# Patient Record
Sex: Female | Born: 1950
Health system: Southern US, Community
[De-identification: ages and names within clinical notes are randomized; demographics above are authoritative.]

## PROBLEM LIST (undated history)

## (undated) DIAGNOSIS — E069 Thyroiditis, unspecified: Secondary | ICD-10-CM

## (undated) DIAGNOSIS — E785 Hyperlipidemia, unspecified: Secondary | ICD-10-CM

## (undated) HISTORY — PX: TONSILLECTOMY: SUR1361

## (undated) HISTORY — PX: DIAGNOSTIC LAPAROSCOPY: SUR761

---

## 2004-04-26 ENCOUNTER — Ambulatory Visit: Payer: Self-pay | Admitting: Gastroenterology

## 2005-08-15 ENCOUNTER — Ambulatory Visit: Payer: Self-pay | Admitting: Unknown Physician Specialty

## 2006-10-07 ENCOUNTER — Ambulatory Visit: Payer: Self-pay | Admitting: Unknown Physician Specialty

## 2007-12-08 ENCOUNTER — Ambulatory Visit: Payer: Self-pay | Admitting: Unknown Physician Specialty

## 2009-01-06 ENCOUNTER — Ambulatory Visit: Payer: Self-pay | Admitting: Podiatry

## 2010-01-15 ENCOUNTER — Ambulatory Visit: Payer: Self-pay | Admitting: Unknown Physician Specialty

## 2011-02-06 ENCOUNTER — Ambulatory Visit: Payer: Self-pay | Admitting: Unknown Physician Specialty

## 2012-02-07 ENCOUNTER — Ambulatory Visit: Payer: Self-pay | Admitting: Physician Assistant

## 2013-02-12 ENCOUNTER — Ambulatory Visit: Payer: Self-pay | Admitting: Physician Assistant

## 2013-04-22 ENCOUNTER — Encounter (HOSPITAL_COMMUNITY): Payer: Self-pay | Admitting: Emergency Medicine

## 2013-04-22 ENCOUNTER — Emergency Department (HOSPITAL_COMMUNITY)
Admission: EM | Admit: 2013-04-22 | Discharge: 2013-04-22 | Disposition: A | Discharge status: Home / Self Care | Payer: BC Managed Care – PPO | Attending: Emergency Medicine | Admitting: Emergency Medicine

## 2013-04-22 DIAGNOSIS — R110 Nausea: Secondary | ICD-10-CM | POA: Insufficient documentation

## 2013-04-22 DIAGNOSIS — Z79899 Other long term (current) drug therapy: Secondary | ICD-10-CM | POA: Insufficient documentation

## 2013-04-22 DIAGNOSIS — R61 Generalized hyperhidrosis: Secondary | ICD-10-CM | POA: Insufficient documentation

## 2013-04-22 DIAGNOSIS — R55 Syncope and collapse: Secondary | ICD-10-CM | POA: Insufficient documentation

## 2013-04-22 LAB — CBC WITH DIFFERENTIAL/PLATELET
Basophils Relative: 0 % (ref 0–1)
HCT: 38.8 % (ref 36.0–46.0)
Hemoglobin: 13.6 g/dL (ref 12.0–15.0)
Lymphs Abs: 1 10*3/uL (ref 0.7–4.0)
MCH: 32.7 pg (ref 26.0–34.0)
MCHC: 35.1 g/dL (ref 30.0–36.0)
Monocytes Absolute: 0.3 10*3/uL (ref 0.1–1.0)
Monocytes Relative: 4 % (ref 3–12)
Neutro Abs: 6 10*3/uL (ref 1.7–7.7)
RBC: 4.16 MIL/uL (ref 3.87–5.11)
WBC: 7.4 10*3/uL (ref 4.0–10.5)

## 2013-04-22 LAB — COMPREHENSIVE METABOLIC PANEL
ALT: 12 U/L (ref 0–35)
AST: 18 U/L (ref 0–37)
CO2: 28 mEq/L (ref 19–32)
Calcium: 9.3 mg/dL (ref 8.4–10.5)
Chloride: 103 mEq/L (ref 96–112)
Creatinine, Ser: 0.77 mg/dL (ref 0.50–1.10)
GFR calc Af Amer: >90 mL/min (ref >90)
GFR calc non Af Amer: 89 mL/min — ABNORMAL LOW (ref >90)
Sodium: 140 mEq/L (ref 135–145)
Total Protein: 6.8 g/dL (ref 6.0–8.3)

## 2013-04-22 NOTE — ED Notes (Signed)
nad noted prior to dc dc instructions reviewed and explained. F/u care instructed. Voiced understanding.

## 2013-04-22 NOTE — ED Notes (Signed)
Pt states generalized weakness this morning with episode of "breaking out into a sweat"
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
## 2014-02-16 ENCOUNTER — Ambulatory Visit: Payer: Self-pay | Admitting: Physician Assistant

## 2014-07-05 ENCOUNTER — Ambulatory Visit: Payer: Self-pay | Admitting: Gastroenterology

## 2015-01-25 ENCOUNTER — Other Ambulatory Visit: Payer: Self-pay | Admitting: Physician Assistant

## 2015-01-25 DIAGNOSIS — Z1231 Encounter for screening mammogram for malignant neoplasm of breast: Secondary | ICD-10-CM

## 2015-03-10 ENCOUNTER — Ambulatory Visit
Admission: RE | Admit: 2015-03-10 | Discharge: 2015-03-10 | Disposition: A | Discharge status: Home / Self Care | Payer: BLUE CROSS/BLUE SHIELD | Source: Ambulatory Visit | Attending: Physician Assistant | Admitting: Physician Assistant

## 2015-03-10 DIAGNOSIS — Z1231 Encounter for screening mammogram for malignant neoplasm of breast: Secondary | ICD-10-CM | POA: Insufficient documentation

## 2016-02-20 ENCOUNTER — Other Ambulatory Visit: Payer: Self-pay | Admitting: Physician Assistant

## 2016-02-20 DIAGNOSIS — Z1231 Encounter for screening mammogram for malignant neoplasm of breast: Secondary | ICD-10-CM

## 2016-03-19 ENCOUNTER — Other Ambulatory Visit: Payer: Self-pay | Admitting: Physician Assistant

## 2016-03-19 ENCOUNTER — Ambulatory Visit
Admission: RE | Admit: 2016-03-19 | Discharge: 2016-03-19 | Disposition: A | Discharge status: Home / Self Care | Payer: BLUE CROSS/BLUE SHIELD | Source: Ambulatory Visit | Attending: Physician Assistant | Admitting: Physician Assistant

## 2016-03-19 DIAGNOSIS — Z1231 Encounter for screening mammogram for malignant neoplasm of breast: Secondary | ICD-10-CM | POA: Diagnosis not present

## 2017-03-12 ENCOUNTER — Other Ambulatory Visit: Payer: Self-pay | Admitting: Physician Assistant

## 2017-03-12 DIAGNOSIS — Z1231 Encounter for screening mammogram for malignant neoplasm of breast: Secondary | ICD-10-CM

## 2017-03-24 ENCOUNTER — Ambulatory Visit
Admission: RE | Admit: 2017-03-24 | Discharge: 2017-03-24 | Disposition: A | Discharge status: Home / Self Care | Payer: BLUE CROSS/BLUE SHIELD | Source: Ambulatory Visit | Attending: Physician Assistant | Admitting: Physician Assistant

## 2017-03-24 DIAGNOSIS — Z1231 Encounter for screening mammogram for malignant neoplasm of breast: Secondary | ICD-10-CM | POA: Insufficient documentation

## 2017-12-29 DIAGNOSIS — E538 Deficiency of other specified B group vitamins: Secondary | ICD-10-CM | POA: Diagnosis not present

## 2018-01-29 DIAGNOSIS — E538 Deficiency of other specified B group vitamins: Secondary | ICD-10-CM | POA: Diagnosis not present

## 2018-03-06 DIAGNOSIS — E538 Deficiency of other specified B group vitamins: Secondary | ICD-10-CM | POA: Diagnosis not present

## 2018-03-11 DIAGNOSIS — Z0000 Encounter for general adult medical examination without abnormal findings: Secondary | ICD-10-CM | POA: Diagnosis not present

## 2018-03-11 DIAGNOSIS — E782 Mixed hyperlipidemia: Secondary | ICD-10-CM | POA: Diagnosis not present

## 2018-03-17 DIAGNOSIS — M810 Age-related osteoporosis without current pathological fracture: Secondary | ICD-10-CM | POA: Diagnosis not present

## 2018-03-17 DIAGNOSIS — E538 Deficiency of other specified B group vitamins: Secondary | ICD-10-CM | POA: Diagnosis not present

## 2018-03-17 DIAGNOSIS — Z1239 Encounter for other screening for malignant neoplasm of breast: Secondary | ICD-10-CM | POA: Diagnosis not present

## 2018-03-17 DIAGNOSIS — E782 Mixed hyperlipidemia: Secondary | ICD-10-CM | POA: Diagnosis not present

## 2018-03-17 DIAGNOSIS — Z0000 Encounter for general adult medical examination without abnormal findings: Secondary | ICD-10-CM | POA: Diagnosis not present

## 2018-03-18 ENCOUNTER — Other Ambulatory Visit: Payer: Self-pay | Admitting: Physician Assistant

## 2018-03-18 DIAGNOSIS — Z1231 Encounter for screening mammogram for malignant neoplasm of breast: Secondary | ICD-10-CM

## 2018-04-03 ENCOUNTER — Ambulatory Visit
Admission: RE | Admit: 2018-04-03 | Discharge: 2018-04-03 | Disposition: A | Discharge status: Home / Self Care | Payer: Medicare HMO | Source: Ambulatory Visit | Attending: Physician Assistant | Admitting: Physician Assistant

## 2018-04-03 DIAGNOSIS — Z1231 Encounter for screening mammogram for malignant neoplasm of breast: Secondary | ICD-10-CM | POA: Insufficient documentation

## 2018-04-03 DIAGNOSIS — M810 Age-related osteoporosis without current pathological fracture: Secondary | ICD-10-CM | POA: Diagnosis not present

## 2018-04-06 DIAGNOSIS — E538 Deficiency of other specified B group vitamins: Secondary | ICD-10-CM | POA: Diagnosis not present

## 2018-05-07 DIAGNOSIS — E538 Deficiency of other specified B group vitamins: Secondary | ICD-10-CM | POA: Diagnosis not present

## 2018-06-09 DIAGNOSIS — E538 Deficiency of other specified B group vitamins: Secondary | ICD-10-CM | POA: Diagnosis not present

## 2018-07-10 DIAGNOSIS — E538 Deficiency of other specified B group vitamins: Secondary | ICD-10-CM | POA: Diagnosis not present

## 2018-08-14 DIAGNOSIS — E538 Deficiency of other specified B group vitamins: Secondary | ICD-10-CM | POA: Diagnosis not present

## 2018-09-02 DIAGNOSIS — E782 Mixed hyperlipidemia: Secondary | ICD-10-CM | POA: Diagnosis not present

## 2018-09-02 DIAGNOSIS — E538 Deficiency of other specified B group vitamins: Secondary | ICD-10-CM | POA: Diagnosis not present

## 2018-09-02 DIAGNOSIS — M810 Age-related osteoporosis without current pathological fracture: Secondary | ICD-10-CM | POA: Diagnosis not present

## 2018-09-16 DIAGNOSIS — M79672 Pain in left foot: Secondary | ICD-10-CM | POA: Diagnosis not present

## 2018-09-16 DIAGNOSIS — B351 Tinea unguium: Secondary | ICD-10-CM | POA: Diagnosis not present

## 2018-09-16 DIAGNOSIS — M898X9 Other specified disorders of bone, unspecified site: Secondary | ICD-10-CM | POA: Diagnosis not present

## 2018-09-16 DIAGNOSIS — D2371 Other benign neoplasm of skin of right lower limb, including hip: Secondary | ICD-10-CM | POA: Diagnosis not present

## 2018-09-16 DIAGNOSIS — M79671 Pain in right foot: Secondary | ICD-10-CM | POA: Diagnosis not present

## 2018-09-16 DIAGNOSIS — D2372 Other benign neoplasm of skin of left lower limb, including hip: Secondary | ICD-10-CM | POA: Diagnosis not present

## 2018-09-18 DIAGNOSIS — Z0000 Encounter for general adult medical examination without abnormal findings: Secondary | ICD-10-CM | POA: Diagnosis not present

## 2018-09-18 DIAGNOSIS — E538 Deficiency of other specified B group vitamins: Secondary | ICD-10-CM | POA: Diagnosis not present

## 2018-09-18 DIAGNOSIS — K5900 Constipation, unspecified: Secondary | ICD-10-CM | POA: Diagnosis not present

## 2018-09-18 DIAGNOSIS — M810 Age-related osteoporosis without current pathological fracture: Secondary | ICD-10-CM | POA: Diagnosis not present

## 2018-09-18 DIAGNOSIS — E782 Mixed hyperlipidemia: Secondary | ICD-10-CM | POA: Diagnosis not present

## 2018-10-07 DIAGNOSIS — M79671 Pain in right foot: Secondary | ICD-10-CM | POA: Diagnosis not present

## 2018-10-07 DIAGNOSIS — D2371 Other benign neoplasm of skin of right lower limb, including hip: Secondary | ICD-10-CM | POA: Diagnosis not present

## 2018-10-07 DIAGNOSIS — D2372 Other benign neoplasm of skin of left lower limb, including hip: Secondary | ICD-10-CM | POA: Diagnosis not present

## 2018-10-07 DIAGNOSIS — M79672 Pain in left foot: Secondary | ICD-10-CM | POA: Diagnosis not present

## 2018-10-19 DIAGNOSIS — E538 Deficiency of other specified B group vitamins: Secondary | ICD-10-CM | POA: Diagnosis not present

## 2018-10-28 DIAGNOSIS — D2371 Other benign neoplasm of skin of right lower limb, including hip: Secondary | ICD-10-CM | POA: Diagnosis not present

## 2018-10-28 DIAGNOSIS — M79672 Pain in left foot: Secondary | ICD-10-CM | POA: Diagnosis not present

## 2018-10-28 DIAGNOSIS — D2372 Other benign neoplasm of skin of left lower limb, including hip: Secondary | ICD-10-CM | POA: Diagnosis not present

## 2018-11-19 DIAGNOSIS — E538 Deficiency of other specified B group vitamins: Secondary | ICD-10-CM | POA: Diagnosis not present

## 2018-12-03 DIAGNOSIS — Z86018 Personal history of other benign neoplasm: Secondary | ICD-10-CM | POA: Diagnosis not present

## 2018-12-03 DIAGNOSIS — L578 Other skin changes due to chronic exposure to nonionizing radiation: Secondary | ICD-10-CM | POA: Diagnosis not present

## 2018-12-03 DIAGNOSIS — Z859 Personal history of malignant neoplasm, unspecified: Secondary | ICD-10-CM | POA: Diagnosis not present

## 2018-12-03 DIAGNOSIS — L821 Other seborrheic keratosis: Secondary | ICD-10-CM | POA: Diagnosis not present

## 2018-12-03 DIAGNOSIS — Z872 Personal history of diseases of the skin and subcutaneous tissue: Secondary | ICD-10-CM | POA: Diagnosis not present

## 2018-12-22 DIAGNOSIS — E538 Deficiency of other specified B group vitamins: Secondary | ICD-10-CM | POA: Diagnosis not present

## 2019-01-22 DIAGNOSIS — E538 Deficiency of other specified B group vitamins: Secondary | ICD-10-CM | POA: Diagnosis not present

## 2019-02-22 DIAGNOSIS — E538 Deficiency of other specified B group vitamins: Secondary | ICD-10-CM | POA: Diagnosis not present

## 2019-03-12 DIAGNOSIS — Z0000 Encounter for general adult medical examination without abnormal findings: Secondary | ICD-10-CM | POA: Diagnosis not present

## 2019-03-12 DIAGNOSIS — E782 Mixed hyperlipidemia: Secondary | ICD-10-CM | POA: Diagnosis not present

## 2019-03-19 DIAGNOSIS — E782 Mixed hyperlipidemia: Secondary | ICD-10-CM | POA: Diagnosis not present

## 2019-03-19 DIAGNOSIS — M8580 Other specified disorders of bone density and structure, unspecified site: Secondary | ICD-10-CM | POA: Diagnosis not present

## 2019-03-19 DIAGNOSIS — Z0000 Encounter for general adult medical examination without abnormal findings: Secondary | ICD-10-CM | POA: Diagnosis not present

## 2019-03-19 DIAGNOSIS — Z1239 Encounter for other screening for malignant neoplasm of breast: Secondary | ICD-10-CM | POA: Diagnosis not present

## 2019-03-19 DIAGNOSIS — Z8639 Personal history of other endocrine, nutritional and metabolic disease: Secondary | ICD-10-CM | POA: Diagnosis not present

## 2019-03-19 DIAGNOSIS — E538 Deficiency of other specified B group vitamins: Secondary | ICD-10-CM | POA: Diagnosis not present

## 2019-03-25 ENCOUNTER — Other Ambulatory Visit: Payer: Self-pay | Admitting: Physician Assistant

## 2019-03-25 DIAGNOSIS — E538 Deficiency of other specified B group vitamins: Secondary | ICD-10-CM | POA: Diagnosis not present

## 2019-03-25 DIAGNOSIS — Z1231 Encounter for screening mammogram for malignant neoplasm of breast: Secondary | ICD-10-CM

## 2019-04-28 DIAGNOSIS — E538 Deficiency of other specified B group vitamins: Secondary | ICD-10-CM | POA: Diagnosis not present

## 2019-05-04 ENCOUNTER — Ambulatory Visit
Admission: RE | Admit: 2019-05-04 | Discharge: 2019-05-04 | Disposition: A | Discharge status: Home / Self Care | Payer: Medicare HMO | Source: Ambulatory Visit | Attending: Physician Assistant | Admitting: Physician Assistant

## 2019-05-04 DIAGNOSIS — Z1231 Encounter for screening mammogram for malignant neoplasm of breast: Secondary | ICD-10-CM | POA: Insufficient documentation

## 2019-06-02 DIAGNOSIS — E538 Deficiency of other specified B group vitamins: Secondary | ICD-10-CM | POA: Diagnosis not present

## 2019-07-07 DIAGNOSIS — E538 Deficiency of other specified B group vitamins: Secondary | ICD-10-CM | POA: Diagnosis not present

## 2019-08-09 DIAGNOSIS — E538 Deficiency of other specified B group vitamins: Secondary | ICD-10-CM | POA: Diagnosis not present

## 2019-09-09 DIAGNOSIS — E538 Deficiency of other specified B group vitamins: Secondary | ICD-10-CM | POA: Diagnosis not present

## 2019-09-09 DIAGNOSIS — E782 Mixed hyperlipidemia: Secondary | ICD-10-CM | POA: Diagnosis not present

## 2019-09-09 DIAGNOSIS — Z8639 Personal history of other endocrine, nutritional and metabolic disease: Secondary | ICD-10-CM | POA: Diagnosis not present

## 2019-09-13 DIAGNOSIS — B351 Tinea unguium: Secondary | ICD-10-CM | POA: Diagnosis not present

## 2019-09-13 DIAGNOSIS — M79672 Pain in left foot: Secondary | ICD-10-CM | POA: Diagnosis not present

## 2019-09-13 DIAGNOSIS — M79671 Pain in right foot: Secondary | ICD-10-CM | POA: Diagnosis not present

## 2019-09-13 DIAGNOSIS — D2372 Other benign neoplasm of skin of left lower limb, including hip: Secondary | ICD-10-CM | POA: Diagnosis not present

## 2019-09-16 DIAGNOSIS — E538 Deficiency of other specified B group vitamins: Secondary | ICD-10-CM | POA: Diagnosis not present

## 2019-09-16 DIAGNOSIS — Z0000 Encounter for general adult medical examination without abnormal findings: Secondary | ICD-10-CM | POA: Diagnosis not present

## 2019-09-16 DIAGNOSIS — Z8639 Personal history of other endocrine, nutritional and metabolic disease: Secondary | ICD-10-CM | POA: Diagnosis not present

## 2019-09-16 DIAGNOSIS — E559 Vitamin D deficiency, unspecified: Secondary | ICD-10-CM | POA: Diagnosis not present

## 2019-09-16 DIAGNOSIS — E785 Hyperlipidemia, unspecified: Secondary | ICD-10-CM | POA: Diagnosis not present

## 2019-09-16 DIAGNOSIS — M8580 Other specified disorders of bone density and structure, unspecified site: Secondary | ICD-10-CM | POA: Diagnosis not present

## 2019-09-23 DIAGNOSIS — R2232 Localized swelling, mass and lump, left upper limb: Secondary | ICD-10-CM | POA: Diagnosis not present

## 2019-09-24 ENCOUNTER — Ambulatory Visit: Payer: Self-pay | Admitting: General Surgery

## 2019-09-24 NOTE — H&P (Signed)
PATIENT PROFILE: Mary Potts is a 69 y.o. female who presents to the Clinic for consultation at the request of Mary Potts, [place] for evaluation of mass on the left arm.  PCP: Mary Nightingale, PA  HISTORY OF PRESENT ILLNESS: Mary Potts reports she has a mass on the left arm since more than 10 years ago. She reported that he has been increasing in size. There is minimal pain. There is no pain radiation. There is no alleviating or aggravating factor. Patient does report discomfort when she pressed on the mass. There is center movement that caused the area to be very uncomfortable. She endorses significant increase in the left last few months. Denies any fever or chills. She does report intermittent changes in skin color and temperature. No drainage.  PROBLEM LIST: Problem List Date Reviewed: 10/29/2018  Noted  B12 deficiency 02/20/2016  Overview  Paresthesias   Osteoporosis 02/16/2015  Hyperlipemia, mixed Unknown  Hx of thyroiditis Unknown  Overview  resolved   Arm fracture Unknown    GENERAL REVIEW OF SYSTEMS:   General ROS: negative for - chills, fatigue, fever, weight gain or weight loss Allergy and Immunology ROS: negative for - hives  Hematological and Lymphatic ROS: negative for - bleeding problems or bruising, negative for palpable nodes Endocrine ROS: negative for - heat or cold intolerance, hair changes Respiratory ROS: negative for - cough, shortness of breath or wheezing Cardiovascular ROS: no chest pain or palpitations GI ROS: negative for nausea, vomiting, abdominal pain, diarrhea, constipation Musculoskeletal ROS: negative for - joint swelling or muscle pain Neurological ROS: negative for - confusion, syncope Dermatological ROS: negative for pruritus and rash Psychiatric: negative for anxiety, depression, difficulty sleeping and memory loss  MEDICATIONS: Current Outpatient Medications  Medication Sig Dispense Refill  . alendronate (FOSAMAX) 70 MG tablet  TAKE 1 TABLET EVERY WEEK WITH A FULL GLASS OF WATER. DO NOT LIE DOWN FOR THE NEXT 30 MINUTES 12 tablet 1  . diazePAM (VALIUM) 5 MG tablet Take 1 tablet (5 mg total) by mouth once daily as needed for Anxiety 15 tablet 1  . GARLIC (GARLIQUE ORAL) Take by mouth.  . lecithin, soy 400 mg Cap Take by mouth once daily.  . multivitamin tablet Take 1 tablet by mouth once daily.  Marland Kitchen omega 3-dha-epa-fish oil 900 mg-360 mg- 455 mg-1,000 mg Cap  . red yeast rice 600 mg Cap Take by mouth once daily.  . vitamin E 400 UNIT capsule Take by mouth once daily.   Current Facility-Administered Medications  Medication Dose Route Frequency Provider Last Rate Last Admin  . cyanocobalamin (VITAMIN B12) injection 1,000 mcg 1,000 mcg Intramuscular Q30 Days Mary Potts [place], [place] 1,000 mcg at 09/09/19 5170   ALLERGIES: Sulfa (sulfonamide antibiotics)  PAST MEDICAL HISTORY: Past Medical History:  Diagnosis Date  . Arm fracture 07/04  . Endometriosis  . Fibrocystic breast disease  . Other and unspecified hyperlipidemia  . Thyroiditis  resolved   PAST SURGICAL HISTORY: Past Surgical History:  Procedure Laterality Date  . COLONOSCOPY 07/05/2014  Normal/Colon/Repeat 70yr/PYO  . EXPLORATORY LAPAROTOMY 1997  . Lysis of adhesions  . TONSILLECTOMY remote past  . TUBAL LIGATION    FAMILY HISTORY: Family History  Problem Relation Age of Onset  . Thyroid cancer Mother  . High blood pressure (Hypertension) Mother  . Stroke Mother    SOCIAL HISTORY: Social History   Socioeconomic History  . Marital status: Married  Spouse name: Not on file  . Number of children: Not on file  .  Years of education: Not on file  . Highest education level: Not on file  Occupational History  . Occupation: [employer]  Social Needs  . Financial resource strain: Not on file  . Food insecurity  Worry: Not on file  Inability: Not on file  . Transportation needs  Medical: Not on file  Non-medical: Not on  file  Tobacco Use  . Smoking status: Never Smoker  . Smokeless tobacco: Never Used  Substance and Sexual Activity  . Alcohol use: No  Alcohol/week: 0.0 standard drinks  . Drug use: No  . Sexual activity: Defer  Other Topics Concern  . Not on file  Social History Narrative  . Not on file   PHYSICAL EXAM: Vitals:  09/23/19 1345  BP: 156/87  Pulse: 90   Body mass index is 26.75 kg/m. Weight: 68.5 kg (151 lb)   GENERAL: Alert, active, oriented x3  HEENT: Pupils equal reactive to light. Extraocular movements are intact. Sclera clear. Palpebral conjunctiva normal red color.Pharynx clear.  NECK: Supple with no palpable mass and no adenopathy.  LUNGS: Sound clear with no rales rhonchi or wheezes.  HEART: Regular rhythm S1 and S2 without murmur.  ABDOMEN: Soft and depressible, nontender with no palpable mass, no hepatomegaly.   EXTREMITIES: Well-developed well-nourished symmetrical with no dependent edema. There is a large mass more than 10 cm on the left arm. The masses continues with the left tricep area. No sign of fluid collection.  NEUROLOGICAL: Awake alert oriented, facial expression symmetrical, moving all extremities.  REVIEW OF DATA: I have reviewed the following data today: Initial consult on 09/23/2019  Component Date Value  . WBC (White Blood Cell Co* 09/23/2019 6.6  . RBC (Red Blood Cell Coun* 09/23/2019 4.12  . Hemoglobin 09/23/2019 13.3  . Hematocrit 09/23/2019 39.3  . MCV (Mean Corpuscular Vo* 09/23/2019 95.4  . MCH (Mean Corpuscular He* 09/23/2019 32.3*  . MCHC (Mean Corpuscular H* 09/23/2019 33.8  . Platelet Count 09/23/2019 260  . RDW-CV (Red Cell Distrib* 09/23/2019 11.4*  . MPV (Mean Platelet Volum* 09/23/2019 11.3  . Neutrophils 09/23/2019 3.82  . Lymphocytes 09/23/2019 2.15  . Monocytes 09/23/2019 0.42  . Eosinophils 09/23/2019 0.16  . Basophils 09/23/2019 0.04  . Neutrophil % 09/23/2019 57.8  . Lymphocyte % 09/23/2019 32.5  . Monocyte %  09/23/2019 6.4  . Eosinophil % 09/23/2019 2.4  . Basophil% 09/23/2019 0.6  . Immature Granulocyte % 09/23/2019 0.3  . Immature Granulocyte Cou* 09/23/2019 0.02  Appointment on 09/09/2019  Component Date Value  . Glucose 09/09/2019 100  . Sodium 09/09/2019 142  . Potassium 09/09/2019 4.4  . Chloride 09/09/2019 105  . Carbon Dioxide (CO2) 09/09/2019 30.2  . Urea Nitrogen (BUN) 09/09/2019 13  . Creatinine 09/09/2019 0.8  . Glomerular Filtration Ra* 09/09/2019 72  . Calcium 09/09/2019 9.1  . AST 09/09/2019 15  . ALT 09/09/2019 17  . Alk Phos (alkaline Phosp* 09/09/2019 72  . Albumin 09/09/2019 3.8  . Bilirubin, Total 09/09/2019 0.5  . Protein, Total 09/09/2019 6.2  . A/G Ratio 09/09/2019 1.6  . Cholesterol, Total 09/09/2019 220*  . Triglyceride 09/09/2019 76  . HDL (High Density Lipopr* 09/09/2019 65.2  . LDL Calculated 09/09/2019 140*  . VLDL Cholesterol 09/09/2019 15  . Cholesterol/HDL Ratio 09/09/2019 3.4  . Thyroid Stimulating Horm* 09/09/2019 1.840    ASSESSMENT: Ms. Fluegge is a 69 y.o. female presenting for consultation for left arm mass.  Patient with a large left arm mass. She was oriented about the most  likely diagnosis of large lipoma. Due to the extension of the mass I would recommend to proceed with excision in the operating room. The dissection of this mass is can be somewhat deeper than usual and may be very uncomfortable for the patient to do it under just local anesthesia in the office. Patient was oriented about the benefit and risk of surgery including bleeding, infection, pain, scar. Patient should not have significant restrictions after surgery. Patient reports agreement with plan.  Mass of arm, left [R22.32]  PLAN: 1. Excision of left arm soft tissue mass (31674) 2. Avoid taking aspirin 5 days before the surgery.  3. Contact us if has any question or concern.   Patient verbalized understanding, all questions were answered, and were agreeable with the plan  outlined above.   Herbert Pun, MD

## 2019-09-24 NOTE — H&P (View-Only) (Signed)
PATIENT PROFILE: Mary Potts is a 69 y.o. female who presents to the Clinic for consultation at the request of Mary Potts, [place] for evaluation of mass on the left arm.  PCP: Mary Nightingale, PA  HISTORY OF PRESENT ILLNESS: Mary Potts reports she has a mass on the left arm since more than 10 years ago. She reported that he has been increasing in size. There is minimal pain. There is no pain radiation. There is no alleviating or aggravating factor. Patient does report discomfort when she pressed on the mass. There is center movement that caused the area to be very uncomfortable. She endorses significant increase in the left last few months. Denies any fever or chills. She does report intermittent changes in skin color and temperature. No drainage.  PROBLEM LIST: Problem List Date Reviewed: 10/29/2018  Noted  B12 deficiency 02/20/2016  Overview  Paresthesias   Osteoporosis 02/16/2015  Hyperlipemia, mixed Unknown  Hx of thyroiditis Unknown  Overview  resolved   Arm fracture Unknown    GENERAL REVIEW OF SYSTEMS:   General ROS: negative for - chills, fatigue, fever, weight gain or weight loss Allergy and Immunology ROS: negative for - hives  Hematological and Lymphatic ROS: negative for - bleeding problems or bruising, negative for palpable nodes Endocrine ROS: negative for - heat or cold intolerance, hair changes Respiratory ROS: negative for - cough, shortness of breath or wheezing Cardiovascular ROS: no chest pain or palpitations GI ROS: negative for nausea, vomiting, abdominal pain, diarrhea, constipation Musculoskeletal ROS: negative for - joint swelling or muscle pain Neurological ROS: negative for - confusion, syncope Dermatological ROS: negative for pruritus and rash Psychiatric: negative for anxiety, depression, difficulty sleeping and memory loss  MEDICATIONS: Current Outpatient Medications  Medication Sig Dispense Refill  . alendronate (FOSAMAX) 70 MG tablet  TAKE 1 TABLET EVERY WEEK WITH A FULL GLASS OF WATER. DO NOT LIE DOWN FOR THE NEXT 30 MINUTES 12 tablet 1  . diazePAM (VALIUM) 5 MG tablet Take 1 tablet (5 mg total) by mouth once daily as needed for Anxiety 15 tablet 1  . GARLIC (GARLIQUE ORAL) Take by mouth.  . lecithin, soy 400 mg Cap Take by mouth once daily.  . multivitamin tablet Take 1 tablet by mouth once daily.  Marland Kitchen omega 3-dha-epa-fish oil 900 mg-360 mg- 455 mg-1,000 mg Cap  . red yeast rice 600 mg Cap Take by mouth once daily.  . vitamin E 400 UNIT capsule Take by mouth once daily.   Current Facility-Administered Medications  Medication Dose Route Frequency Provider Last Rate Last Admin  . cyanocobalamin (VITAMIN B12) injection 1,000 mcg 1,000 mcg Intramuscular Q30 Days Mary Potts [place], [place] 1,000 mcg at 09/09/19 5997   ALLERGIES: Sulfa (sulfonamide antibiotics)  PAST MEDICAL HISTORY: Past Medical History:  Diagnosis Date  . Arm fracture 07/04  . Endometriosis  . Fibrocystic breast disease  . Other and unspecified hyperlipidemia  . Thyroiditis  resolved   PAST SURGICAL HISTORY: Past Surgical History:  Procedure Laterality Date  . COLONOSCOPY 07/05/2014  Normal/Colon/Repeat 13yr/PYO  . EXPLORATORY LAPAROTOMY 1997  . Lysis of adhesions  . TONSILLECTOMY remote past  . TUBAL LIGATION    FAMILY HISTORY: Family History  Problem Relation Age of Onset  . Thyroid cancer Mother  . High blood pressure (Hypertension) Mother  . Stroke Mother    SOCIAL HISTORY: Social History   Socioeconomic History  . Marital status: Married  Spouse name: Not on file  . Number of children: Not on file  .  Years of education: Not on file  . Highest education level: Not on file  Occupational History  . Occupation: [employer]  Social Needs  . Financial resource strain: Not on file  . Food insecurity  Worry: Not on file  Inability: Not on file  . Transportation needs  Medical: Not on file  Non-medical: Not on  file  Tobacco Use  . Smoking status: Never Smoker  . Smokeless tobacco: Never Used  Substance and Sexual Activity  . Alcohol use: No  Alcohol/week: 0.0 standard drinks  . Drug use: No  . Sexual activity: Defer  Other Topics Concern  . Not on file  Social History Narrative  . Not on file   PHYSICAL EXAM: Vitals:  09/23/19 1345  BP: 156/87  Pulse: 90   Body mass index is 26.75 kg/m. Weight: 68.5 kg (151 lb)   GENERAL: Alert, active, oriented x3  HEENT: Pupils equal reactive to light. Extraocular movements are intact. Sclera clear. Palpebral conjunctiva normal red color.Pharynx clear.  NECK: Supple with no palpable mass and no adenopathy.  LUNGS: Sound clear with no rales rhonchi or wheezes.  HEART: Regular rhythm S1 and S2 without murmur.  ABDOMEN: Soft and depressible, nontender with no palpable mass, no hepatomegaly.   EXTREMITIES: Well-developed well-nourished symmetrical with no dependent edema. There is a large mass more than 10 cm on the left arm. The masses continues with the left tricep area. No sign of fluid collection.  NEUROLOGICAL: Awake alert oriented, facial expression symmetrical, moving all extremities.  REVIEW OF DATA: I have reviewed the following data today: Initial consult on 09/23/2019  Component Date Value  . WBC (White Blood Cell Co* 09/23/2019 6.6  . RBC (Red Blood Cell Coun* 09/23/2019 4.12  . Hemoglobin 09/23/2019 13.3  . Hematocrit 09/23/2019 39.3  . MCV (Mean Corpuscular Vo* 09/23/2019 95.4  . MCH (Mean Corpuscular He* 09/23/2019 32.3*  . MCHC (Mean Corpuscular H* 09/23/2019 33.8  . Platelet Count 09/23/2019 260  . RDW-CV (Red Cell Distrib* 09/23/2019 11.4*  . MPV (Mean Platelet Volum* 09/23/2019 11.3  . Neutrophils 09/23/2019 3.82  . Lymphocytes 09/23/2019 2.15  . Monocytes 09/23/2019 0.42  . Eosinophils 09/23/2019 0.16  . Basophils 09/23/2019 0.04  . Neutrophil % 09/23/2019 57.8  . Lymphocyte % 09/23/2019 32.5  . Monocyte %  09/23/2019 6.4  . Eosinophil % 09/23/2019 2.4  . Basophil% 09/23/2019 0.6  . Immature Granulocyte % 09/23/2019 0.3  . Immature Granulocyte Cou* 09/23/2019 0.02  Appointment on 09/09/2019  Component Date Value  . Glucose 09/09/2019 100  . Sodium 09/09/2019 142  . Potassium 09/09/2019 4.4  . Chloride 09/09/2019 105  . Carbon Dioxide (CO2) 09/09/2019 30.2  . Urea Nitrogen (BUN) 09/09/2019 13  . Creatinine 09/09/2019 0.8  . Glomerular Filtration Ra* 09/09/2019 72  . Calcium 09/09/2019 9.1  . AST 09/09/2019 15  . ALT 09/09/2019 17  . Alk Phos (alkaline Phosp* 09/09/2019 72  . Albumin 09/09/2019 3.8  . Bilirubin, Total 09/09/2019 0.5  . Protein, Total 09/09/2019 6.2  . A/G Ratio 09/09/2019 1.6  . Cholesterol, Total 09/09/2019 220*  . Triglyceride 09/09/2019 76  . HDL (High Density Lipopr* 09/09/2019 65.2  . LDL Calculated 09/09/2019 140*  . VLDL Cholesterol 09/09/2019 15  . Cholesterol/HDL Ratio 09/09/2019 3.4  . Thyroid Stimulating Horm* 09/09/2019 1.840    ASSESSMENT: Ms. Hoyos is a 69 y.o. female presenting for consultation for left arm mass.  Patient with a large left arm mass. She was oriented about the most  likely diagnosis of large lipoma. Due to the extension of the mass I would recommend to proceed with excision in the operating room. The dissection of this mass is can be somewhat deeper than usual and may be very uncomfortable for the patient to do it under just local anesthesia in the office. Patient was oriented about the benefit and risk of surgery including bleeding, infection, pain, scar. Patient should not have significant restrictions after surgery. Patient reports agreement with plan.  Mass of arm, left [R22.32]  PLAN: 1. Excision of left arm soft tissue mass (38685) 2. Avoid taking aspirin 5 days before the surgery.  3. Contact us if has any question or concern.   Patient verbalized understanding, all questions were answered, and were agreeable with the plan  outlined above.   Herbert Pun, MD

## 2019-10-04 DIAGNOSIS — M79672 Pain in left foot: Secondary | ICD-10-CM | POA: Diagnosis not present

## 2019-10-04 DIAGNOSIS — M79671 Pain in right foot: Secondary | ICD-10-CM | POA: Diagnosis not present

## 2019-10-04 DIAGNOSIS — D2372 Other benign neoplasm of skin of left lower limb, including hip: Secondary | ICD-10-CM | POA: Diagnosis not present

## 2019-10-05 ENCOUNTER — Encounter
Admission: RE | Admit: 2019-10-05 | Discharge: 2019-10-05 | Disposition: A | Discharge status: Home / Self Care | Payer: Medicare HMO | Source: Ambulatory Visit | Attending: General Surgery | Admitting: General Surgery

## 2019-10-05 ENCOUNTER — Other Ambulatory Visit: Payer: Self-pay

## 2019-10-05 DIAGNOSIS — Z20822 Contact with and (suspected) exposure to covid-19: Secondary | ICD-10-CM | POA: Insufficient documentation

## 2019-10-05 DIAGNOSIS — Z01818 Encounter for other preprocedural examination: Secondary | ICD-10-CM | POA: Insufficient documentation

## 2019-10-05 HISTORY — DX: Thyroiditis, unspecified: E06.9

## 2019-10-05 HISTORY — DX: Hyperlipidemia, unspecified: E78.5

## 2019-10-05 NOTE — Patient Instructions (Signed)
Your procedure is scheduled on: 10/08/19 Report to [facility]. To find out your arrival time please call [phone] between 1PM - 3PM on 10/07/19.  Remember: Instructions that are not followed completely may result in serious medical risk, up to and including death, or upon the discretion of your surgeon and anesthesiologist your surgery may need to be rescheduled.     _X__ 1. Do not eat food after midnight the night before your procedure.                 No gum chewing or hard candies. You may drink clear liquids up to 2 hours                 before you are scheduled to arrive for your surgery- DO not drink clear                 liquids within 2 hours of the start of your surgery.                 Clear Liquids include:  water, apple juice without pulp, clear carbohydrate                 drink such as Clearfast or Gatorade, Black Coffee or Tea (Do not add                 anything to coffee or tea). Diabetics water only  __X__2.  On the morning of surgery brush your teeth with toothpaste and water, you                 may rinse your mouth with mouthwash if you wish.  Do not swallow any              toothpaste of mouthwash.     _X__ 3.  No Alcohol for 24 hours before or after surgery.   _X__ 4.  Do Not Smoke or use e-cigarettes For 24 Hours Prior to Your Surgery.                 Do not use any chewable tobacco products for at least 6 hours prior to                 surgery.  ____  5.  Bring all medications with you on the day of surgery if instructed.   __X__  6.  Notify your doctor if there is any change in your medical condition      (cold, fever, infections).     Do not wear jewelry, make-up, hairpins, clips or nail polish. Do not wear lotions, powders, or perfumes.  Do not shave 48 hours prior to surgery. Men may shave face and neck. Do not bring valuables to the hospital.    [facility] is not responsible for any belongings or  valuables.  Contacts, dentures/partials or body piercings may not be worn into surgery. Bring a case for your contacts, glasses or hearing aids, a denture cup will be supplied. Leave your suitcase in the car. After surgery it may be brought to your room. For patients admitted to the hospital, discharge time is determined by your treatment team.   Patients discharged the day of surgery will not be allowed to drive home.   Please read over the following fact sheets that you were given:   MRSA Information  __X__ Take these medicines the morning of surgery with A SIP OF WATER:  1. NONE  2.   3.   4.  5.  6.  ____ Fleet Enema (as directed)   __X__ Use CHG Soap/SAGE wipes as directed  ____ Use inhalers on the day of surgery  ____ Stop metformin/Janumet/Farxiga 2 days prior to surgery    ____ Take 1/2 of usual insulin dose the night before surgery. No insulin the morning          of surgery.   ____ Stop Blood Thinners Coumadin/Plavix/Xarelto/Pleta/Pradaxa/Eliquis/Effient/Aspirin  on   Or contact your Surgeon, Cardiologist or Medical Doctor regarding  ability to stop your blood thinners  __X__ Stop Anti-inflammatories 7 days before surgery such as Advil, Ibuprofen, Motrin,  BC or Goodies Powder, Naprosyn, Naproxen, Aleve, Aspirin    __X__ Stop all herbal supplements, fish oil or vitamin E until after surgery.    ____ Bring C-Pap to the hospital.

## 2019-10-06 ENCOUNTER — Other Ambulatory Visit
Admission: RE | Admit: 2019-10-06 | Discharge: 2019-10-06 | Disposition: A | Discharge status: Home / Self Care | Payer: Medicare HMO | Source: Ambulatory Visit | Attending: Surgery | Admitting: Surgery

## 2019-10-06 DIAGNOSIS — Z20822 Contact with and (suspected) exposure to covid-19: Secondary | ICD-10-CM | POA: Diagnosis not present

## 2019-10-06 DIAGNOSIS — Z01818 Encounter for other preprocedural examination: Secondary | ICD-10-CM | POA: Diagnosis not present

## 2019-10-06 DIAGNOSIS — I10 Essential (primary) hypertension: Secondary | ICD-10-CM | POA: Diagnosis not present

## 2019-10-06 LAB — SARS CORONAVIRUS 2 (TAT 6-24 HRS): SARS Coronavirus 2: NEGATIVE

## 2019-10-08 ENCOUNTER — Ambulatory Visit: Payer: Medicare HMO | Admitting: Registered Nurse

## 2019-10-08 ENCOUNTER — Other Ambulatory Visit: Payer: Self-pay

## 2019-10-08 ENCOUNTER — Ambulatory Visit
Admission: RE | Admit: 2019-10-08 | Discharge: 2019-10-08 | Disposition: A | Discharge status: Home / Self Care | Payer: Medicare HMO | Attending: General Surgery | Admitting: General Surgery

## 2019-10-08 ENCOUNTER — Encounter: Payer: Self-pay | Admitting: General Surgery

## 2019-10-08 ENCOUNTER — Encounter
Admission: RE | Disposition: A | Discharge status: Home / Self Care | Payer: Self-pay | Source: Home / Self Care | Attending: General Surgery

## 2019-10-08 DIAGNOSIS — D1722 Benign lipomatous neoplasm of skin and subcutaneous tissue of left arm: Secondary | ICD-10-CM | POA: Diagnosis not present

## 2019-10-08 DIAGNOSIS — Z79899 Other long term (current) drug therapy: Secondary | ICD-10-CM | POA: Diagnosis not present

## 2019-10-08 DIAGNOSIS — R2232 Localized swelling, mass and lump, left upper limb: Secondary | ICD-10-CM | POA: Diagnosis not present

## 2019-10-08 DIAGNOSIS — Z882 Allergy status to sulfonamides status: Secondary | ICD-10-CM | POA: Insufficient documentation

## 2019-10-08 HISTORY — PX: EXCISION MASS UPPER EXTREMETIES: SHX6704

## 2019-10-08 SURGERY — EXCISION MASS UPPER EXTREMITIES
Anesthesia: General | Site: Arm Upper | Laterality: Left | Wound class: "Clean "

## 2019-10-08 MED ORDER — LIDOCAINE HCL (CARDIAC) PF 100 MG/5ML IV SOSY
PREFILLED_SYRINGE | INTRAVENOUS | Status: DC | PRN
  Administered 2019-10-08: 60 mg via INTRAVENOUS

## 2019-10-08 MED ORDER — MIDAZOLAM HCL 2 MG/2ML IJ SOLN
INTRAMUSCULAR | Status: DC | PRN
  Administered 2019-10-08: 2 mg via INTRAVENOUS

## 2019-10-08 MED ORDER — EPHEDRINE 5 MG/ML INJ
INTRAVENOUS | Status: AC
  Filled 2019-10-08: qty 10

## 2019-10-08 MED ORDER — ONDANSETRON HCL 4 MG/2ML IJ SOLN: 4.0000 mg | Freq: Once | INTRAMUSCULAR | Status: DC | PRN

## 2019-10-08 MED ORDER — CEFAZOLIN SODIUM-DEXTROSE 2-4 GM/100ML-% IV SOLN
2.0000 g | INTRAVENOUS | Status: AC
  Administered 2019-10-08: 2 g via INTRAVENOUS

## 2019-10-08 MED ORDER — MIDAZOLAM HCL 2 MG/2ML IJ SOLN
INTRAMUSCULAR | Status: AC
  Filled 2019-10-08: qty 2

## 2019-10-08 MED ORDER — FAMOTIDINE 20 MG PO TABS: 20.0000 mg | ORAL_TABLET | Freq: Once | ORAL | Status: AC

## 2019-10-08 MED ORDER — FENTANYL CITRATE (PF) 100 MCG/2ML IJ SOLN
INTRAMUSCULAR | Status: DC | PRN
  Administered 2019-10-08: 25 ug via INTRAVENOUS

## 2019-10-08 MED ORDER — HYDROCODONE-ACETAMINOPHEN 5-325 MG PO TABS: 1.0000 | ORAL_TABLET | ORAL | 0 refills | Status: AC | PRN

## 2019-10-08 MED ORDER — PROPOFOL 10 MG/ML IV BOLUS
INTRAVENOUS | Status: DC | PRN
  Administered 2019-10-08: 150 mg via INTRAVENOUS

## 2019-10-08 MED ORDER — DEXAMETHASONE SODIUM PHOSPHATE 10 MG/ML IJ SOLN
INTRAMUSCULAR | Status: AC
  Filled 2019-10-08: qty 1

## 2019-10-08 MED ORDER — EPHEDRINE SULFATE 50 MG/ML IJ SOLN
INTRAMUSCULAR | Status: DC | PRN
  Administered 2019-10-08: 5 mg via INTRAVENOUS

## 2019-10-08 MED ORDER — BUPIVACAINE-EPINEPHRINE (PF) 0.5% -1:200000 IJ SOLN
INTRAMUSCULAR | Status: AC
  Filled 2019-10-08: qty 90

## 2019-10-08 MED ORDER — FENTANYL CITRATE (PF) 100 MCG/2ML IJ SOLN: 25.0000 ug | INTRAMUSCULAR | Status: DC | PRN

## 2019-10-08 MED ORDER — ONDANSETRON HCL 4 MG/2ML IJ SOLN
INTRAMUSCULAR | Status: DC | PRN
  Administered 2019-10-08: 4 mg via INTRAVENOUS

## 2019-10-08 MED ORDER — FAMOTIDINE 20 MG PO TABS
ORAL_TABLET | ORAL | Status: AC
  Administered 2019-10-08: 20 mg via ORAL
  Filled 2019-10-08: qty 1

## 2019-10-08 MED ORDER — CEFAZOLIN SODIUM-DEXTROSE 2-4 GM/100ML-% IV SOLN
INTRAVENOUS | Status: AC
  Filled 2019-10-08: qty 100

## 2019-10-08 MED ORDER — LACTATED RINGERS IV SOLN
INTRAVENOUS | Status: DC
  Administered 2019-10-08: 08:00:00 125 mL/h via INTRAVENOUS

## 2019-10-08 MED ORDER — ONDANSETRON HCL 4 MG/2ML IJ SOLN
INTRAMUSCULAR | Status: AC
  Filled 2019-10-08: qty 2

## 2019-10-08 MED ORDER — FENTANYL CITRATE (PF) 100 MCG/2ML IJ SOLN
INTRAMUSCULAR | Status: AC
  Filled 2019-10-08: qty 2

## 2019-10-08 MED ORDER — BUPIVACAINE-EPINEPHRINE 0.5% -1:200000 IJ SOLN
INTRAMUSCULAR | Status: DC | PRN
  Administered 2019-10-08: 5 mL

## 2019-10-08 MED ORDER — DEXAMETHASONE SODIUM PHOSPHATE 10 MG/ML IJ SOLN
INTRAMUSCULAR | Status: DC | PRN
  Administered 2019-10-08: 10 mg via INTRAVENOUS

## 2019-10-08 SURGICAL SUPPLY — 36 items
BLADE SURG 15 STRL LF DISP TIS (BLADE) ×1 IMPLANT
BLADE SURG 15 STRL SS (BLADE) ×2
CANISTER SUCT 1200ML W/VALVE (MISCELLANEOUS) ×3 IMPLANT
CHLORAPREP W/TINT 26 (MISCELLANEOUS) ×3 IMPLANT
COVER WAND RF STERILE (DRAPES) ×3 IMPLANT
DERMABOND ADVANCED (GAUZE/BANDAGES/DRESSINGS) ×2
DERMABOND ADVANCED .7 DNX12 (GAUZE/BANDAGES/DRESSINGS) ×1 IMPLANT
DRAPE 3/4 80X56 (DRAPES) ×3 IMPLANT
ELECT CAUTERY BLADE 6.4 (BLADE) ×3 IMPLANT
ELECT REM PT RETURN 9FT ADLT (ELECTROSURGICAL) ×3
ELECTRODE REM PT RTRN 9FT ADLT (ELECTROSURGICAL) ×1 IMPLANT
GLOVE BIO SURGEON STRL SZ 6.5 (GLOVE) ×2 IMPLANT
GLOVE BIO SURGEONS STRL SZ 6.5 (GLOVE) ×1
GLOVE BIOGEL PI IND STRL 6.5 (GLOVE) ×1 IMPLANT
GLOVE BIOGEL PI INDICATOR 6.5 (GLOVE) ×2
GOWN STRL REUS W/ TWL LRG LVL3 (GOWN DISPOSABLE) ×2 IMPLANT
GOWN STRL REUS W/TWL LRG LVL3 (GOWN DISPOSABLE) ×8
HANDLE YANKAUER SUCT BULB TIP (MISCELLANEOUS) ×2 IMPLANT
KIT TURNOVER KIT A (KITS) ×3 IMPLANT
LABEL OR SOLS (LABEL) ×3 IMPLANT
NDL HYPO 25X1 1.5 SAFETY (NEEDLE) ×1 IMPLANT
NEEDLE HYPO 22GX1.5 SAFETY (NEEDLE) ×3 IMPLANT
NEEDLE HYPO 25X1 1.5 SAFETY (NEEDLE) ×3 IMPLANT
NS IRRIG 500ML POUR BTL (IV SOLUTION) ×3 IMPLANT
PACK EXTREMITY (MISCELLANEOUS) ×3 IMPLANT
PAD PREP 24X41 OB/GYN DISP (PERSONAL CARE ITEMS) ×3 IMPLANT
STOCKINETTE BIAS CUT 4 980044 (GAUZE/BANDAGES/DRESSINGS) ×3 IMPLANT
SUT MNCRL 4-0 (SUTURE) ×2
SUT MNCRL 4-0 27XMFL (SUTURE) ×1
SUT VIC AB 2-0 SH 27 (SUTURE) ×2
SUT VIC AB 2-0 SH 27XBRD (SUTURE) ×1 IMPLANT
SUT VIC AB 3-0 SH 27 (SUTURE) ×2
SUT VIC AB 3-0 SH 27X BRD (SUTURE) ×1 IMPLANT
SUTURE MNCRL 4-0 27XMF (SUTURE) ×1 IMPLANT
SYR 10ML LL (SYRINGE) ×3 IMPLANT
TAPE TRANSPORE STRL 2 31045 (GAUZE/BANDAGES/DRESSINGS) ×2 IMPLANT

## 2019-10-08 NOTE — Interval H&P Note (Signed)
History and Physical Interval Note:  10/08/2019 9:06 AM  Mary Potts  has presented today for surgery, with the diagnosis of R22.32 mass of arm left.  The various methods of treatment have been discussed with the patient and family. After consideration of risks, benefits and other options for treatment, the patient has consented to  Procedure(s): EXCISION MASS UPPER EXTREMETIES (Left) as a surgical intervention.  The patient's history has been reviewed, patient examined, no change in status, stable for surgery.  I have reviewed the patient's chart and labs.  Left arm marked in the pre procedure room. Questions were answered to the patient's satisfaction.     Herbert Pun

## 2019-10-08 NOTE — Anesthesia Procedure Notes (Signed)
Procedure Name: LMA Insertion Date/Time: 10/08/2019 9:31 AM Performed by: Hedda Slade, CRNA Pre-anesthesia Checklist: Patient identified, Patient being monitored, Timeout performed, Emergency Drugs available and Suction available Patient Re-evaluated:Patient Re-evaluated prior to induction Oxygen Delivery Method: Circle system utilized Preoxygenation: Pre-oxygenation with 100% oxygen Induction Type: IV induction Ventilation: Mask ventilation without difficulty LMA: LMA inserted LMA Size: 3.5 Tube type: Oral Number of attempts: 1 Placement Confirmation: positive ETCO2 and breath sounds checked- equal and bilateral Tube secured with: Tape Dental Injury: Teeth and Oropharynx as per pre-operative assessment

## 2019-10-08 NOTE — Anesthesia Preprocedure Evaluation (Addendum)
Anesthesia Evaluation  Patient identified by MRN, date of birth, ID band Patient awake    Reviewed: Allergy & Precautions, H&P , NPO status , Patient's Chart, lab work & pertinent test results  Airway Mallampati: II  TM Distance: >3 FB Neck ROM: full    Dental  (+) Partial Lower, Partial Upper   Pulmonary neg pulmonary ROS, neg shortness of breath, neg COPD,    breath sounds clear to auscultation       Cardiovascular (-) hypertension(-) angina(-) Past MI and (-) Cardiac Stents negative cardio ROS  (-) dysrhythmias  Rhythm:regular Rate:Normal     Neuro/Psych negative neurological ROS  negative psych ROS   GI/Hepatic negative GI ROS, Neg liver ROS,   Endo/Other  negative endocrine ROS  Renal/GU      Musculoskeletal   Abdominal   Peds  Hematology negative hematology ROS (+)   Anesthesia Other Findings Past Medical History: No date: HLD (hyperlipidemia) No date: Thyroiditis  Past Surgical History: No date: DIAGNOSTIC LAPAROSCOPY No date: TONSILLECTOMY  BMI    Body Mass Index: 26.05 kg/m      Reproductive/Obstetrics negative OB ROS                            Anesthesia Physical Anesthesia Plan  ASA: I  Anesthesia Plan: General LMA   Post-op Pain Management:    Induction:   PONV Risk Score and Plan: Dexamethasone, Ondansetron and Treatment may vary due to age or medical condition  Airway Management Planned:   Additional Equipment:   Intra-op Plan:   Post-operative Plan:   Informed Consent: I have reviewed the patients History and Physical, chart, labs and discussed the procedure including the risks, benefits and alternatives for the proposed anesthesia with the patient or authorized representative who has indicated his/her understanding and acceptance.     Dental Advisory Given  Plan Discussed with: Anesthesiologist  Anesthesia Plan Comments:        Anesthesia  Quick Evaluation

## 2019-10-08 NOTE — Discharge Instructions (Addendum)
  Diet: Resume home heart healthy regular diet.   Activity: Increase activity as tolerated. Light activity and walking are encouraged. Do not drive or drink alcohol if taking narcotic pain medications.  Wound care: May shower with soapy water and pat dry (do not rub incisions), but no baths or submerging incision underwater until follow-up. (no swimming)   Medications: Resume all home medications. For mild to moderate pain: acetaminophen (Tylenol) or ibuprofen (if no kidney disease). Combining Tylenol with alcohol can substantially increase your risk of causing liver disease. Narcotic pain medications, if prescribed, can be used for severe pain, though may cause nausea, constipation, and drowsiness. Do not combine Tylenol and Norco within a 6 hour period as Norco contains Tylenol. If you do not need the narcotic pain medication, you do not need to fill the prescription.   AMBULATORY SURGERY  DISCHARGE INSTRUCTIONS   1) The drugs that you were given will stay in your system until tomorrow so for the next 24 hours you should not:  A) Drive an automobile B) Make any legal decisions C) Drink any alcoholic beverage   2) You may resume regular meals tomorrow.  Today it is better to start with liquids and gradually work up to solid foods.  You may eat anything you prefer, but it is better to start with liquids, then soup and crackers, and gradually work up to solid foods.   3) Please notify your doctor immediately if you have any unusual bleeding, trouble breathing, redness and pain at the surgery site, drainage, fever, or pain not relieved by medication.    4) Additional Instructions:        Please contact your physician with any problems or Same Day Surgery at [phone], Monday through Friday 6 am to 4 pm, or [facility] number at [phone]. Call office ([phone]) at any time if any questions, worsening pain, fevers/chills, bleeding, drainage from incision  site, or other concerns.  

## 2019-10-08 NOTE — Transfer of Care (Signed)
Immediate Anesthesia Transfer of Care Note  Patient: Keyarah Grunwald  Procedure(s) Performed: EXCISION MASS UPPER EXTREMETIES (Left Arm Upper)  Patient Location: PACU  Anesthesia Type:General  Level of Consciousness: awake, alert  and oriented  Airway & Oxygen Therapy: Patient Spontanous Breathing and Patient connected to face mask oxygen  Post-op Assessment: Report given to RN and Post -op Vital signs reviewed and stable  Post vital signs: Reviewed and stable  Last Vitals:  Vitals Value Taken Time  BP 150/73 10/08/19 1021  Temp 36.3 C 10/08/19 1021  Pulse 98 10/08/19 1023  Resp 14 10/08/19 1023  SpO2 100 % 10/08/19 1023  Vitals shown include unvalidated device data.  Last Pain:  Vitals:   10/08/19 0805  TempSrc: Tympanic  PainSc: 0-No pain         Complications: No apparent anesthesia complications

## 2019-10-08 NOTE — Op Note (Addendum)
  Procedure Date:  10/08/2019  Pre-operative Diagnosis:  Left arm Lipoma  Post-operative Diagnosis: Left arm Lipoma  Procedure:  Excision of left arm Lipoma  Surgeon:  Herbert Pun, MD, FACS  Anesthesia:  General (LMA)  Estimated Blood Loss:  3 ml  Specimens:  Left arm Lipoma  Complications:  None  Indications for Procedure:  This is a 69 y.o. female with diagnosis of a symptomatic left arm lipoma that has been increasing in size.  The patient wishes to have this excised. The risks of bleeding, abscess or infection, injury to surrounding structures, and need for further procedures were all discussed with the patient and she was willing to proceed.  Description of Procedure: The patient was correctly identified in the preoperative area and brought into the operating room.  The patient was placed supine with VTE prophylaxis in place.  Appropriate time-outs were performed.  Anesthesia was induced and the patient was intubated with LMA.  Appropriate antibiotics were infused.  The patient's left arm was prepped and draped in usual sterile fashion.  A elliptical incision was made over the lipoma, and cautery was used to dissect down the subcutaneous tissue to the lipoma itself.  Skin flaps were created using cautery as well, and then the lipoma was excised using cautery, intact. The lipoma extended deep to the muscle fascia and into the triceps muscle.  In vivo, lipoma measured 11 cm. It was sent off to pathology.  The cavity was then irrigated and hemostasis was assured with cautery.  Local anesthetic was infused intradermally.  The wound was then closed in multiple layers using 2-0 Vicryl, 3-0 Vicryl and 4-0 Monocryl.  The incision was cleaned and sealed with DermaBond.  The patient was then emerged from anesthesia, extubated, and brought to the recovery room for further management.    The patient tolerated the procedure well and all counts were correct at the end of the  case.  Herbert Pun, MD, FACS

## 2019-10-09 NOTE — Anesthesia Postprocedure Evaluation (Signed)
Anesthesia Post Note  Patient: Mary Potts  Procedure(s) Performed: EXCISION MASS UPPER EXTREMETIES (Left Arm Upper)  Patient location during evaluation: PACU Anesthesia Type: General Level of consciousness: awake and alert Pain management: pain level controlled Vital Signs Assessment: post-procedure vital signs reviewed and stable Respiratory status: spontaneous breathing, nonlabored ventilation and respiratory function stable Cardiovascular status: blood pressure returned to baseline and stable Postop Assessment: no apparent nausea or vomiting Anesthetic complications: no     Last Vitals:  Vitals:   10/08/19 1051 10/08/19 1103  BP: (!) 147/69 (!) 154/71  Pulse: 85 88  Resp: 19 16  Temp: (!) 36.3 C (!) 36.3 C  SpO2: 97% 98%    Last Pain:  Vitals:   10/08/19 1103  TempSrc: Temporal  PainSc: 0-No pain                 Tera Mater

## 2019-10-11 DIAGNOSIS — E538 Deficiency of other specified B group vitamins: Secondary | ICD-10-CM | POA: Diagnosis not present

## 2019-10-11 LAB — SURGICAL PATHOLOGY

## 2019-10-12 ENCOUNTER — Other Ambulatory Visit: Payer: Medicare HMO

## 2019-10-14 ENCOUNTER — Other Ambulatory Visit: Payer: Medicare HMO

## 2019-11-11 DIAGNOSIS — E538 Deficiency of other specified B group vitamins: Secondary | ICD-10-CM | POA: Diagnosis not present

## 2019-12-13 DIAGNOSIS — E538 Deficiency of other specified B group vitamins: Secondary | ICD-10-CM | POA: Diagnosis not present

## 2019-12-14 DIAGNOSIS — Z0289 Encounter for other administrative examinations: Secondary | ICD-10-CM | POA: Diagnosis not present

## 2020-01-10 DIAGNOSIS — L578 Other skin changes due to chronic exposure to nonionizing radiation: Secondary | ICD-10-CM | POA: Diagnosis not present

## 2020-01-10 DIAGNOSIS — Z872 Personal history of diseases of the skin and subcutaneous tissue: Secondary | ICD-10-CM | POA: Diagnosis not present

## 2020-01-10 DIAGNOSIS — Z86018 Personal history of other benign neoplasm: Secondary | ICD-10-CM | POA: Diagnosis not present

## 2020-01-10 DIAGNOSIS — Z859 Personal history of malignant neoplasm, unspecified: Secondary | ICD-10-CM | POA: Diagnosis not present

## 2020-01-10 DIAGNOSIS — L821 Other seborrheic keratosis: Secondary | ICD-10-CM | POA: Diagnosis not present

## 2020-01-13 DIAGNOSIS — E538 Deficiency of other specified B group vitamins: Secondary | ICD-10-CM | POA: Diagnosis not present

## 2020-02-14 DIAGNOSIS — E538 Deficiency of other specified B group vitamins: Secondary | ICD-10-CM | POA: Diagnosis not present

## 2020-03-13 DIAGNOSIS — E782 Mixed hyperlipidemia: Secondary | ICD-10-CM | POA: Diagnosis not present

## 2020-03-13 DIAGNOSIS — Z0000 Encounter for general adult medical examination without abnormal findings: Secondary | ICD-10-CM | POA: Diagnosis not present

## 2020-03-20 DIAGNOSIS — E538 Deficiency of other specified B group vitamins: Secondary | ICD-10-CM | POA: Diagnosis not present

## 2020-03-28 DIAGNOSIS — S46812A Strain of other muscles, fascia and tendons at shoulder and upper arm level, left arm, initial encounter: Secondary | ICD-10-CM | POA: Diagnosis not present

## 2020-03-28 DIAGNOSIS — Z0000 Encounter for general adult medical examination without abnormal findings: Secondary | ICD-10-CM | POA: Diagnosis not present

## 2020-03-28 DIAGNOSIS — M810 Age-related osteoporosis without current pathological fracture: Secondary | ICD-10-CM | POA: Diagnosis not present

## 2020-03-28 DIAGNOSIS — Z23 Encounter for immunization: Secondary | ICD-10-CM | POA: Diagnosis not present

## 2020-03-28 DIAGNOSIS — J9690 Respiratory failure, unspecified, unspecified whether with hypoxia or hypercapnia: Secondary | ICD-10-CM | POA: Diagnosis not present

## 2020-03-28 DIAGNOSIS — E782 Mixed hyperlipidemia: Secondary | ICD-10-CM | POA: Diagnosis not present

## 2020-03-28 DIAGNOSIS — E538 Deficiency of other specified B group vitamins: Secondary | ICD-10-CM | POA: Diagnosis not present

## 2020-03-28 DIAGNOSIS — M25512 Pain in left shoulder: Secondary | ICD-10-CM | POA: Diagnosis not present

## 2020-03-28 DIAGNOSIS — Z1231 Encounter for screening mammogram for malignant neoplasm of breast: Secondary | ICD-10-CM | POA: Diagnosis not present

## 2020-04-03 ENCOUNTER — Other Ambulatory Visit: Payer: Self-pay | Admitting: Physician Assistant

## 2020-04-03 DIAGNOSIS — Z1231 Encounter for screening mammogram for malignant neoplasm of breast: Secondary | ICD-10-CM

## 2020-04-11 DIAGNOSIS — M79671 Pain in right foot: Secondary | ICD-10-CM | POA: Diagnosis not present

## 2020-04-11 DIAGNOSIS — D2372 Other benign neoplasm of skin of left lower limb, including hip: Secondary | ICD-10-CM | POA: Diagnosis not present

## 2020-04-11 DIAGNOSIS — M79672 Pain in left foot: Secondary | ICD-10-CM | POA: Diagnosis not present

## 2020-04-11 DIAGNOSIS — M2011 Hallux valgus (acquired), right foot: Secondary | ICD-10-CM | POA: Diagnosis not present

## 2020-04-11 DIAGNOSIS — M2041 Other hammer toe(s) (acquired), right foot: Secondary | ICD-10-CM | POA: Diagnosis not present

## 2020-04-11 DIAGNOSIS — M2012 Hallux valgus (acquired), left foot: Secondary | ICD-10-CM | POA: Diagnosis not present

## 2020-04-20 DIAGNOSIS — E538 Deficiency of other specified B group vitamins: Secondary | ICD-10-CM | POA: Diagnosis not present

## 2020-05-04 ENCOUNTER — Other Ambulatory Visit: Payer: Self-pay

## 2020-05-04 ENCOUNTER — Ambulatory Visit
Admission: RE | Admit: 2020-05-04 | Discharge: 2020-05-04 | Disposition: A | Discharge status: Home / Self Care | Payer: Medicare HMO | Source: Ambulatory Visit | Attending: Physician Assistant | Admitting: Physician Assistant

## 2020-05-04 DIAGNOSIS — Z1231 Encounter for screening mammogram for malignant neoplasm of breast: Secondary | ICD-10-CM | POA: Diagnosis not present

## 2020-05-22 DIAGNOSIS — E538 Deficiency of other specified B group vitamins: Secondary | ICD-10-CM | POA: Diagnosis not present

## 2020-06-22 DIAGNOSIS — E538 Deficiency of other specified B group vitamins: Secondary | ICD-10-CM | POA: Diagnosis not present

## 2020-07-24 DIAGNOSIS — E538 Deficiency of other specified B group vitamins: Secondary | ICD-10-CM | POA: Diagnosis not present

## 2020-08-24 DIAGNOSIS — E538 Deficiency of other specified B group vitamins: Secondary | ICD-10-CM | POA: Diagnosis not present

## 2020-09-19 DIAGNOSIS — M810 Age-related osteoporosis without current pathological fracture: Secondary | ICD-10-CM | POA: Diagnosis not present

## 2020-09-19 DIAGNOSIS — E782 Mixed hyperlipidemia: Secondary | ICD-10-CM | POA: Diagnosis not present

## 2020-09-26 DIAGNOSIS — Z8639 Personal history of other endocrine, nutritional and metabolic disease: Secondary | ICD-10-CM | POA: Diagnosis not present

## 2020-09-26 DIAGNOSIS — Z0000 Encounter for general adult medical examination without abnormal findings: Secondary | ICD-10-CM | POA: Diagnosis not present

## 2020-09-26 DIAGNOSIS — E538 Deficiency of other specified B group vitamins: Secondary | ICD-10-CM | POA: Diagnosis not present

## 2020-09-26 DIAGNOSIS — M810 Age-related osteoporosis without current pathological fracture: Secondary | ICD-10-CM | POA: Diagnosis not present

## 2020-09-26 DIAGNOSIS — E782 Mixed hyperlipidemia: Secondary | ICD-10-CM | POA: Diagnosis not present

## 2020-10-27 DIAGNOSIS — E538 Deficiency of other specified B group vitamins: Secondary | ICD-10-CM | POA: Diagnosis not present

## 2020-11-27 DIAGNOSIS — E538 Deficiency of other specified B group vitamins: Secondary | ICD-10-CM | POA: Diagnosis not present

## 2020-12-05 DIAGNOSIS — D2372 Other benign neoplasm of skin of left lower limb, including hip: Secondary | ICD-10-CM | POA: Diagnosis not present

## 2020-12-05 DIAGNOSIS — M79671 Pain in right foot: Secondary | ICD-10-CM | POA: Diagnosis not present

## 2020-12-05 DIAGNOSIS — M2012 Hallux valgus (acquired), left foot: Secondary | ICD-10-CM | POA: Diagnosis not present

## 2020-12-05 DIAGNOSIS — M79675 Pain in left toe(s): Secondary | ICD-10-CM | POA: Diagnosis not present

## 2020-12-05 DIAGNOSIS — M2011 Hallux valgus (acquired), right foot: Secondary | ICD-10-CM | POA: Diagnosis not present

## 2020-12-05 DIAGNOSIS — B351 Tinea unguium: Secondary | ICD-10-CM | POA: Diagnosis not present

## 2020-12-05 DIAGNOSIS — M79674 Pain in right toe(s): Secondary | ICD-10-CM | POA: Diagnosis not present

## 2020-12-05 DIAGNOSIS — D2371 Other benign neoplasm of skin of right lower limb, including hip: Secondary | ICD-10-CM | POA: Diagnosis not present

## 2020-12-05 DIAGNOSIS — M2041 Other hammer toe(s) (acquired), right foot: Secondary | ICD-10-CM | POA: Diagnosis not present

## 2020-12-28 DIAGNOSIS — E538 Deficiency of other specified B group vitamins: Secondary | ICD-10-CM | POA: Diagnosis not present

## 2021-01-09 DIAGNOSIS — L821 Other seborrheic keratosis: Secondary | ICD-10-CM | POA: Diagnosis not present

## 2021-01-09 DIAGNOSIS — Z872 Personal history of diseases of the skin and subcutaneous tissue: Secondary | ICD-10-CM | POA: Diagnosis not present

## 2021-01-09 DIAGNOSIS — L578 Other skin changes due to chronic exposure to nonionizing radiation: Secondary | ICD-10-CM | POA: Diagnosis not present

## 2021-01-09 DIAGNOSIS — Z859 Personal history of malignant neoplasm, unspecified: Secondary | ICD-10-CM | POA: Diagnosis not present

## 2021-01-09 DIAGNOSIS — Z86018 Personal history of other benign neoplasm: Secondary | ICD-10-CM | POA: Diagnosis not present

## 2021-01-23 DIAGNOSIS — H2513 Age-related nuclear cataract, bilateral: Secondary | ICD-10-CM | POA: Diagnosis not present

## 2021-01-30 DIAGNOSIS — E538 Deficiency of other specified B group vitamins: Secondary | ICD-10-CM | POA: Diagnosis not present

## 2021-02-11 IMAGING — MG DIGITAL SCREENING BILAT W/ TOMO W/ CAD
8 series · 8 of 24 positions shown · non-contrast
Comparison: Previous exam(s).

CLINICAL DATA: Screening.

EXAM:
DIGITAL SCREENING BILATERAL MAMMOGRAM WITH TOMO AND CAD

[R MLO synth-2D]
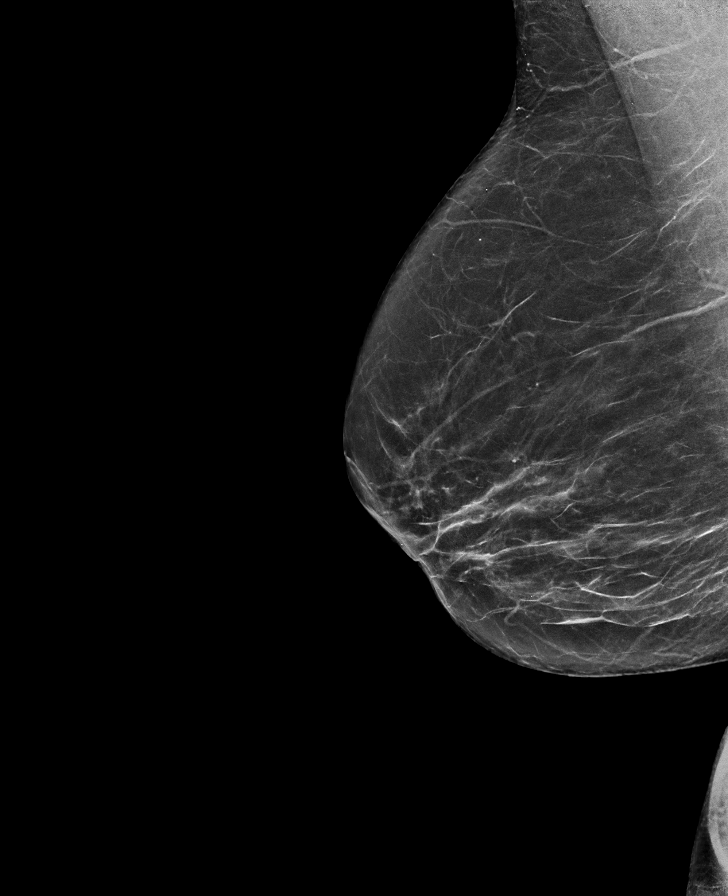

[L MLO synth-2D]
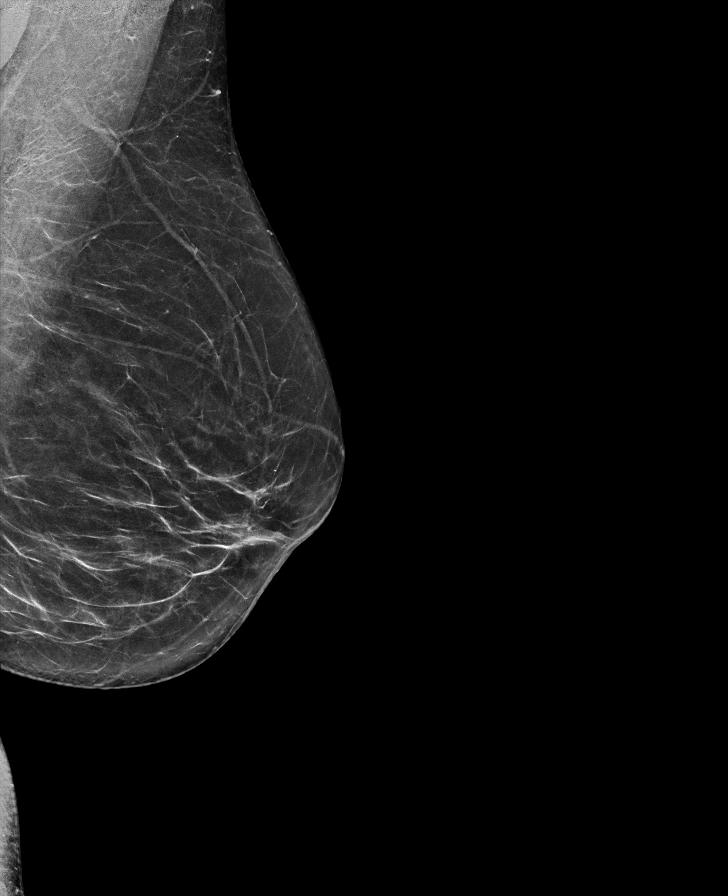

[L CC synth-2D]
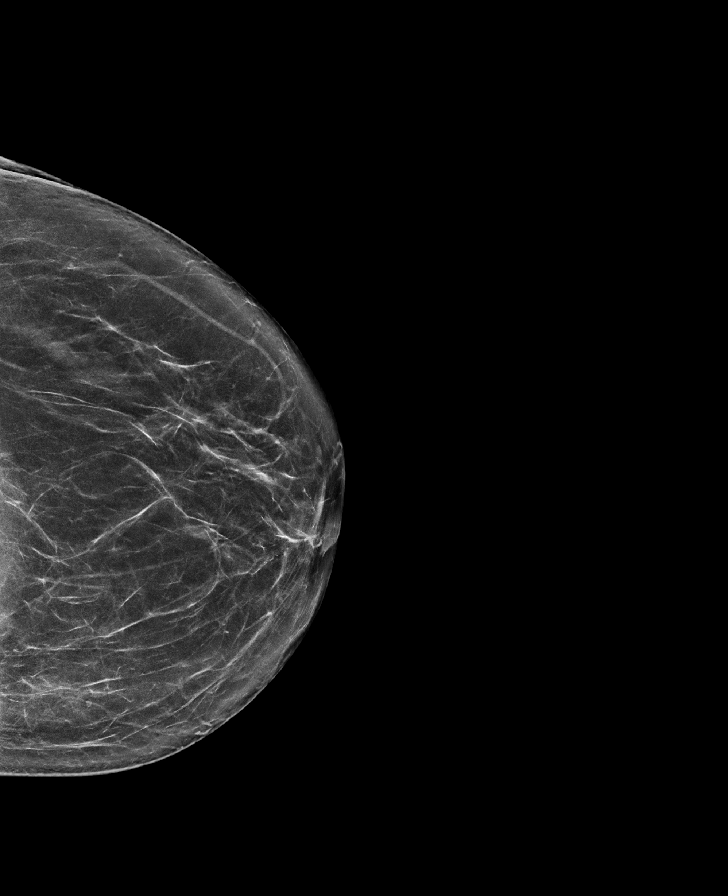

[R CC synth-2D]
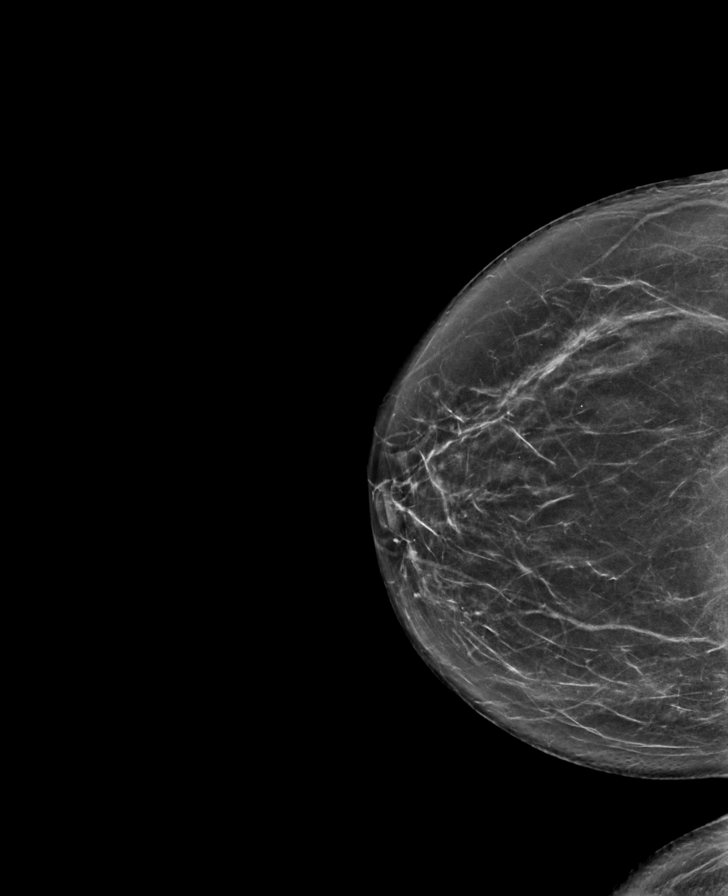

[R MLO tomo · tomo slice 35/69.0]
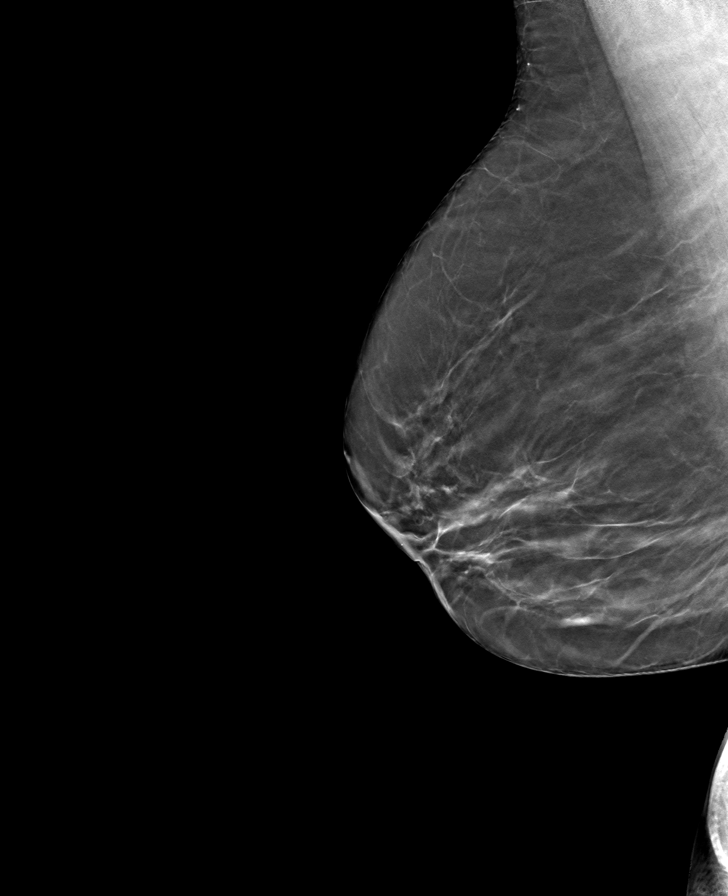

[L MLO tomo · tomo slice 36/71.0]
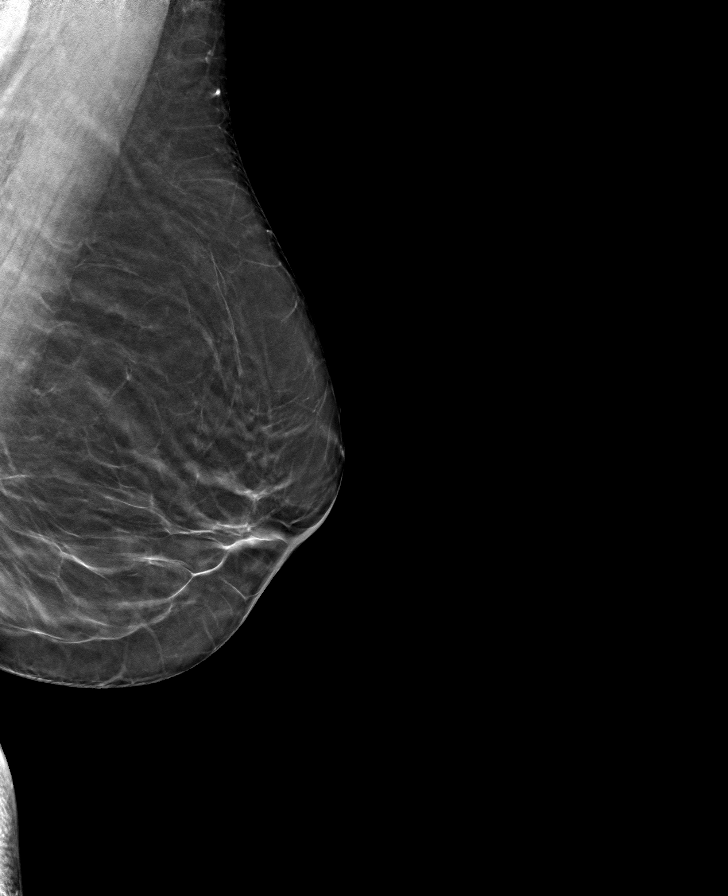

[L CC tomo · tomo slice 35/68.0]
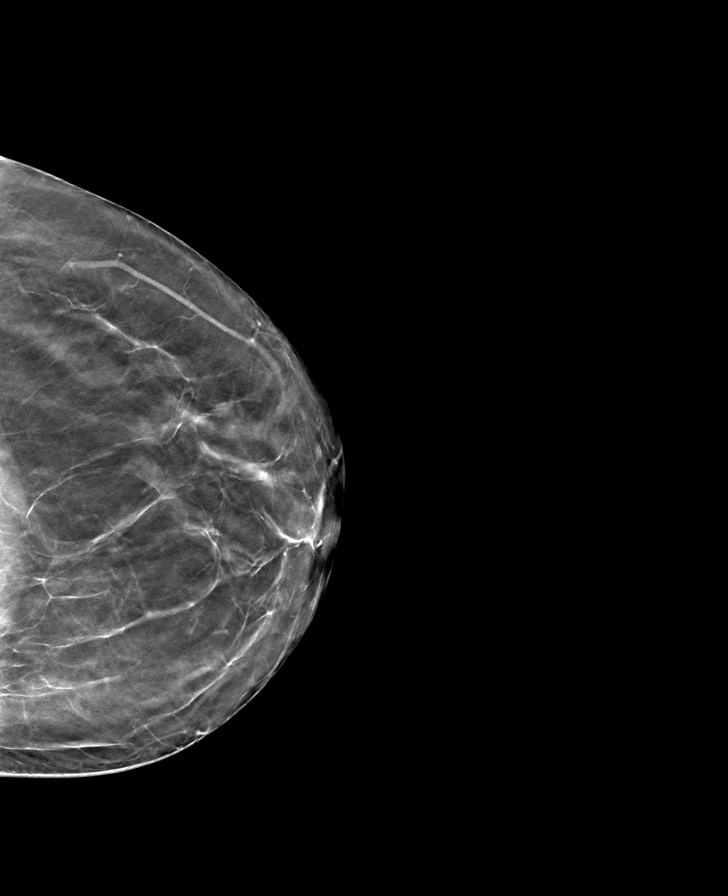

[R CC tomo · tomo slice 35/68.0]
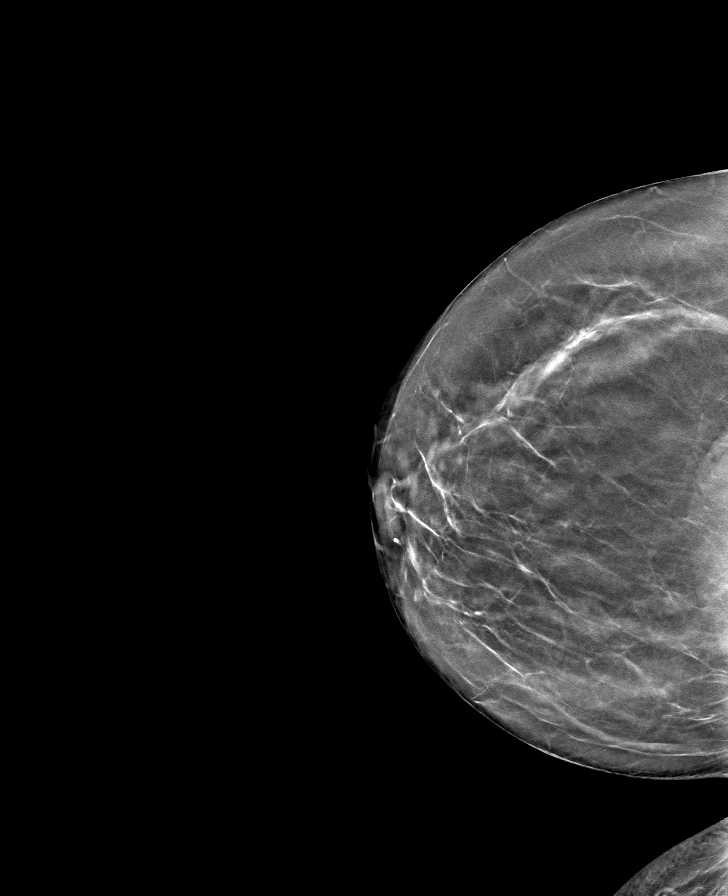

[8 of 24 positions shown; findings below may reference images not displayed]

ACR Breast Density Category b: There are scattered areas of
fibroglandular density.
FINDINGS: There are no findings suspicious for malignancy. Images were
processed with CAD.
IMPRESSION: No mammographic evidence of malignancy. A result letter of this
screening mammogram will be mailed directly to the patient.

RECOMMENDATION:
Screening mammogram in one year. (Code:[id number]-775)

BI-RADS CATEGORY  1: Negative.

## 2021-03-02 DIAGNOSIS — E538 Deficiency of other specified B group vitamins: Secondary | ICD-10-CM | POA: Diagnosis not present

## 2021-03-22 DIAGNOSIS — E782 Mixed hyperlipidemia: Secondary | ICD-10-CM | POA: Diagnosis not present

## 2021-03-22 DIAGNOSIS — Z8639 Personal history of other endocrine, nutritional and metabolic disease: Secondary | ICD-10-CM | POA: Diagnosis not present

## 2021-03-22 DIAGNOSIS — Z0000 Encounter for general adult medical examination without abnormal findings: Secondary | ICD-10-CM | POA: Diagnosis not present

## 2021-04-03 ENCOUNTER — Other Ambulatory Visit: Payer: Self-pay | Admitting: Family Medicine

## 2021-04-03 DIAGNOSIS — Z23 Encounter for immunization: Secondary | ICD-10-CM | POA: Diagnosis not present

## 2021-04-03 DIAGNOSIS — Z8639 Personal history of other endocrine, nutritional and metabolic disease: Secondary | ICD-10-CM | POA: Diagnosis not present

## 2021-04-03 DIAGNOSIS — E538 Deficiency of other specified B group vitamins: Secondary | ICD-10-CM | POA: Diagnosis not present

## 2021-04-03 DIAGNOSIS — Z1231 Encounter for screening mammogram for malignant neoplasm of breast: Secondary | ICD-10-CM

## 2021-04-03 DIAGNOSIS — Z0000 Encounter for general adult medical examination without abnormal findings: Secondary | ICD-10-CM | POA: Diagnosis not present

## 2021-04-03 DIAGNOSIS — E782 Mixed hyperlipidemia: Secondary | ICD-10-CM | POA: Diagnosis not present

## 2021-04-03 DIAGNOSIS — M810 Age-related osteoporosis without current pathological fracture: Secondary | ICD-10-CM | POA: Diagnosis not present

## 2021-05-07 ENCOUNTER — Other Ambulatory Visit: Payer: Self-pay

## 2021-05-07 ENCOUNTER — Ambulatory Visit
Admission: RE | Admit: 2021-05-07 | Discharge: 2021-05-07 | Disposition: A | Discharge status: Home / Self Care | Payer: Medicare HMO | Source: Ambulatory Visit | Attending: Family Medicine | Admitting: Family Medicine

## 2021-05-07 DIAGNOSIS — Z1231 Encounter for screening mammogram for malignant neoplasm of breast: Secondary | ICD-10-CM | POA: Diagnosis not present

## 2021-05-07 DIAGNOSIS — E538 Deficiency of other specified B group vitamins: Secondary | ICD-10-CM | POA: Diagnosis not present

## 2021-05-24 DIAGNOSIS — D2371 Other benign neoplasm of skin of right lower limb, including hip: Secondary | ICD-10-CM | POA: Diagnosis not present

## 2021-05-24 DIAGNOSIS — M79671 Pain in right foot: Secondary | ICD-10-CM | POA: Diagnosis not present

## 2021-05-24 DIAGNOSIS — D2372 Other benign neoplasm of skin of left lower limb, including hip: Secondary | ICD-10-CM | POA: Diagnosis not present

## 2021-05-24 DIAGNOSIS — M79672 Pain in left foot: Secondary | ICD-10-CM | POA: Diagnosis not present

## 2021-06-07 DIAGNOSIS — E538 Deficiency of other specified B group vitamins: Secondary | ICD-10-CM | POA: Diagnosis not present

## 2021-07-09 DIAGNOSIS — E538 Deficiency of other specified B group vitamins: Secondary | ICD-10-CM | POA: Diagnosis not present

## 2021-07-30 DIAGNOSIS — M810 Age-related osteoporosis without current pathological fracture: Secondary | ICD-10-CM | POA: Diagnosis not present

## 2021-08-09 DIAGNOSIS — E538 Deficiency of other specified B group vitamins: Secondary | ICD-10-CM | POA: Diagnosis not present

## 2021-09-10 DIAGNOSIS — E538 Deficiency of other specified B group vitamins: Secondary | ICD-10-CM | POA: Diagnosis not present

## 2021-09-25 DIAGNOSIS — E782 Mixed hyperlipidemia: Secondary | ICD-10-CM | POA: Diagnosis not present

## 2021-09-25 DIAGNOSIS — M810 Age-related osteoporosis without current pathological fracture: Secondary | ICD-10-CM | POA: Diagnosis not present

## 2021-09-25 DIAGNOSIS — Z8639 Personal history of other endocrine, nutritional and metabolic disease: Secondary | ICD-10-CM | POA: Diagnosis not present

## 2021-09-25 DIAGNOSIS — E538 Deficiency of other specified B group vitamins: Secondary | ICD-10-CM | POA: Diagnosis not present

## 2021-10-02 DIAGNOSIS — Z0000 Encounter for general adult medical examination without abnormal findings: Secondary | ICD-10-CM | POA: Diagnosis not present

## 2021-10-02 DIAGNOSIS — Z131 Encounter for screening for diabetes mellitus: Secondary | ICD-10-CM | POA: Diagnosis not present

## 2021-10-02 DIAGNOSIS — R030 Elevated blood-pressure reading, without diagnosis of hypertension: Secondary | ICD-10-CM | POA: Diagnosis not present

## 2021-10-02 DIAGNOSIS — M810 Age-related osteoporosis without current pathological fracture: Secondary | ICD-10-CM | POA: Diagnosis not present

## 2021-10-02 DIAGNOSIS — E538 Deficiency of other specified B group vitamins: Secondary | ICD-10-CM | POA: Diagnosis not present

## 2021-10-02 DIAGNOSIS — E782 Mixed hyperlipidemia: Secondary | ICD-10-CM | POA: Diagnosis not present

## 2021-10-02 DIAGNOSIS — Z8639 Personal history of other endocrine, nutritional and metabolic disease: Secondary | ICD-10-CM | POA: Diagnosis not present

## 2021-10-11 DIAGNOSIS — E538 Deficiency of other specified B group vitamins: Secondary | ICD-10-CM | POA: Diagnosis not present

## 2021-11-12 DIAGNOSIS — E538 Deficiency of other specified B group vitamins: Secondary | ICD-10-CM | POA: Diagnosis not present

## 2021-12-13 DIAGNOSIS — E538 Deficiency of other specified B group vitamins: Secondary | ICD-10-CM | POA: Diagnosis not present

## 2021-12-27 DIAGNOSIS — L821 Other seborrheic keratosis: Secondary | ICD-10-CM | POA: Diagnosis not present

## 2021-12-27 DIAGNOSIS — L578 Other skin changes due to chronic exposure to nonionizing radiation: Secondary | ICD-10-CM | POA: Diagnosis not present

## 2021-12-27 DIAGNOSIS — Z859 Personal history of malignant neoplasm, unspecified: Secondary | ICD-10-CM | POA: Diagnosis not present

## 2021-12-27 DIAGNOSIS — Z872 Personal history of diseases of the skin and subcutaneous tissue: Secondary | ICD-10-CM | POA: Diagnosis not present

## 2021-12-27 DIAGNOSIS — L570 Actinic keratosis: Secondary | ICD-10-CM | POA: Diagnosis not present

## 2021-12-27 DIAGNOSIS — Z86018 Personal history of other benign neoplasm: Secondary | ICD-10-CM | POA: Diagnosis not present

## 2021-12-27 DIAGNOSIS — L918 Other hypertrophic disorders of the skin: Secondary | ICD-10-CM | POA: Diagnosis not present

## 2022-01-14 DIAGNOSIS — E538 Deficiency of other specified B group vitamins: Secondary | ICD-10-CM | POA: Diagnosis not present

## 2022-02-14 DIAGNOSIS — E538 Deficiency of other specified B group vitamins: Secondary | ICD-10-CM | POA: Diagnosis not present

## 2022-03-18 DIAGNOSIS — E538 Deficiency of other specified B group vitamins: Secondary | ICD-10-CM | POA: Diagnosis not present

## 2022-03-28 DIAGNOSIS — Z131 Encounter for screening for diabetes mellitus: Secondary | ICD-10-CM | POA: Diagnosis not present

## 2022-03-28 DIAGNOSIS — E782 Mixed hyperlipidemia: Secondary | ICD-10-CM | POA: Diagnosis not present

## 2022-03-28 DIAGNOSIS — Z8639 Personal history of other endocrine, nutritional and metabolic disease: Secondary | ICD-10-CM | POA: Diagnosis not present

## 2022-03-28 DIAGNOSIS — E538 Deficiency of other specified B group vitamins: Secondary | ICD-10-CM | POA: Diagnosis not present

## 2022-04-04 ENCOUNTER — Other Ambulatory Visit: Payer: Self-pay | Admitting: Physician Assistant

## 2022-04-04 DIAGNOSIS — Z23 Encounter for immunization: Secondary | ICD-10-CM | POA: Diagnosis not present

## 2022-04-04 DIAGNOSIS — M810 Age-related osteoporosis without current pathological fracture: Secondary | ICD-10-CM | POA: Diagnosis not present

## 2022-04-04 DIAGNOSIS — E782 Mixed hyperlipidemia: Secondary | ICD-10-CM | POA: Diagnosis not present

## 2022-04-04 DIAGNOSIS — Z0000 Encounter for general adult medical examination without abnormal findings: Secondary | ICD-10-CM | POA: Diagnosis not present

## 2022-04-04 DIAGNOSIS — Z1231 Encounter for screening mammogram for malignant neoplasm of breast: Secondary | ICD-10-CM | POA: Diagnosis not present

## 2022-04-04 DIAGNOSIS — E538 Deficiency of other specified B group vitamins: Secondary | ICD-10-CM | POA: Diagnosis not present

## 2022-04-18 DIAGNOSIS — E538 Deficiency of other specified B group vitamins: Secondary | ICD-10-CM | POA: Diagnosis not present

## 2022-04-25 DIAGNOSIS — M21621 Bunionette of right foot: Secondary | ICD-10-CM | POA: Diagnosis not present

## 2022-04-25 DIAGNOSIS — M79671 Pain in right foot: Secondary | ICD-10-CM | POA: Diagnosis not present

## 2022-04-25 DIAGNOSIS — D2371 Other benign neoplasm of skin of right lower limb, including hip: Secondary | ICD-10-CM | POA: Diagnosis not present

## 2022-04-25 DIAGNOSIS — M79672 Pain in left foot: Secondary | ICD-10-CM | POA: Diagnosis not present

## 2022-05-09 ENCOUNTER — Ambulatory Visit
Admission: RE | Admit: 2022-05-09 | Discharge: 2022-05-09 | Disposition: A | Discharge status: Home / Self Care | Payer: Medicare HMO | Source: Ambulatory Visit | Attending: Physician Assistant | Admitting: Physician Assistant

## 2022-05-09 DIAGNOSIS — Z1231 Encounter for screening mammogram for malignant neoplasm of breast: Secondary | ICD-10-CM | POA: Insufficient documentation

## 2022-05-09 DIAGNOSIS — M79671 Pain in right foot: Secondary | ICD-10-CM | POA: Diagnosis not present

## 2022-05-09 DIAGNOSIS — M79672 Pain in left foot: Secondary | ICD-10-CM | POA: Diagnosis not present

## 2022-05-09 DIAGNOSIS — D2371 Other benign neoplasm of skin of right lower limb, including hip: Secondary | ICD-10-CM | POA: Diagnosis not present

## 2022-05-09 DIAGNOSIS — M21621 Bunionette of right foot: Secondary | ICD-10-CM | POA: Diagnosis not present

## 2022-05-20 DIAGNOSIS — E538 Deficiency of other specified B group vitamins: Secondary | ICD-10-CM | POA: Diagnosis not present

## 2022-06-20 DIAGNOSIS — E538 Deficiency of other specified B group vitamins: Secondary | ICD-10-CM | POA: Diagnosis not present

## 2022-07-22 DIAGNOSIS — E538 Deficiency of other specified B group vitamins: Secondary | ICD-10-CM | POA: Diagnosis not present

## 2022-08-22 DIAGNOSIS — E538 Deficiency of other specified B group vitamins: Secondary | ICD-10-CM | POA: Diagnosis not present

## 2022-08-26 DIAGNOSIS — H524 Presbyopia: Secondary | ICD-10-CM | POA: Diagnosis not present

## 2022-08-27 DIAGNOSIS — H524 Presbyopia: Secondary | ICD-10-CM | POA: Diagnosis not present

## 2022-09-02 DIAGNOSIS — H35371 Puckering of macula, right eye: Secondary | ICD-10-CM | POA: Diagnosis not present

## 2022-09-02 DIAGNOSIS — H43813 Vitreous degeneration, bilateral: Secondary | ICD-10-CM | POA: Diagnosis not present

## 2022-09-02 DIAGNOSIS — H35341 Macular cyst, hole, or pseudohole, right eye: Secondary | ICD-10-CM | POA: Diagnosis not present

## 2022-09-23 DIAGNOSIS — E538 Deficiency of other specified B group vitamins: Secondary | ICD-10-CM | POA: Diagnosis not present

## 2022-10-01 DIAGNOSIS — E538 Deficiency of other specified B group vitamins: Secondary | ICD-10-CM | POA: Diagnosis not present

## 2022-10-01 DIAGNOSIS — Z131 Encounter for screening for diabetes mellitus: Secondary | ICD-10-CM | POA: Diagnosis not present

## 2022-10-01 DIAGNOSIS — M810 Age-related osteoporosis without current pathological fracture: Secondary | ICD-10-CM | POA: Diagnosis not present

## 2022-10-01 DIAGNOSIS — E782 Mixed hyperlipidemia: Secondary | ICD-10-CM | POA: Diagnosis not present

## 2022-10-01 DIAGNOSIS — Z8639 Personal history of other endocrine, nutritional and metabolic disease: Secondary | ICD-10-CM | POA: Diagnosis not present

## 2022-10-08 DIAGNOSIS — M810 Age-related osteoporosis without current pathological fracture: Secondary | ICD-10-CM | POA: Diagnosis not present

## 2022-10-08 DIAGNOSIS — E538 Deficiency of other specified B group vitamins: Secondary | ICD-10-CM | POA: Diagnosis not present

## 2022-10-08 DIAGNOSIS — Z0000 Encounter for general adult medical examination without abnormal findings: Secondary | ICD-10-CM | POA: Diagnosis not present

## 2022-10-08 DIAGNOSIS — E782 Mixed hyperlipidemia: Secondary | ICD-10-CM | POA: Diagnosis not present

## 2022-10-25 DIAGNOSIS — E538 Deficiency of other specified B group vitamins: Secondary | ICD-10-CM | POA: Diagnosis not present

## 2022-11-26 DIAGNOSIS — E538 Deficiency of other specified B group vitamins: Secondary | ICD-10-CM | POA: Diagnosis not present

## 2022-12-25 DIAGNOSIS — H43813 Vitreous degeneration, bilateral: Secondary | ICD-10-CM | POA: Diagnosis not present

## 2022-12-25 DIAGNOSIS — H35341 Macular cyst, hole, or pseudohole, right eye: Secondary | ICD-10-CM | POA: Diagnosis not present

## 2022-12-25 DIAGNOSIS — H35371 Puckering of macula, right eye: Secondary | ICD-10-CM | POA: Diagnosis not present

## 2022-12-27 DIAGNOSIS — E538 Deficiency of other specified B group vitamins: Secondary | ICD-10-CM | POA: Diagnosis not present

## 2022-12-31 DIAGNOSIS — L578 Other skin changes due to chronic exposure to nonionizing radiation: Secondary | ICD-10-CM | POA: Diagnosis not present

## 2022-12-31 DIAGNOSIS — L821 Other seborrheic keratosis: Secondary | ICD-10-CM | POA: Diagnosis not present

## 2022-12-31 DIAGNOSIS — L298 Other pruritus: Secondary | ICD-10-CM | POA: Diagnosis not present

## 2022-12-31 DIAGNOSIS — Z859 Personal history of malignant neoplasm, unspecified: Secondary | ICD-10-CM | POA: Diagnosis not present

## 2022-12-31 DIAGNOSIS — I781 Nevus, non-neoplastic: Secondary | ICD-10-CM | POA: Diagnosis not present

## 2022-12-31 DIAGNOSIS — Z872 Personal history of diseases of the skin and subcutaneous tissue: Secondary | ICD-10-CM | POA: Diagnosis not present

## 2022-12-31 DIAGNOSIS — L738 Other specified follicular disorders: Secondary | ICD-10-CM | POA: Diagnosis not present

## 2022-12-31 DIAGNOSIS — Z86018 Personal history of other benign neoplasm: Secondary | ICD-10-CM | POA: Diagnosis not present

## 2022-12-31 DIAGNOSIS — L570 Actinic keratosis: Secondary | ICD-10-CM | POA: Diagnosis not present

## 2023-01-28 DIAGNOSIS — E538 Deficiency of other specified B group vitamins: Secondary | ICD-10-CM | POA: Diagnosis not present

## 2023-02-28 DIAGNOSIS — E538 Deficiency of other specified B group vitamins: Secondary | ICD-10-CM | POA: Diagnosis not present

## 2023-03-31 DIAGNOSIS — E538 Deficiency of other specified B group vitamins: Secondary | ICD-10-CM | POA: Diagnosis not present

## 2023-04-03 DIAGNOSIS — M810 Age-related osteoporosis without current pathological fracture: Secondary | ICD-10-CM | POA: Diagnosis not present

## 2023-04-03 DIAGNOSIS — E538 Deficiency of other specified B group vitamins: Secondary | ICD-10-CM | POA: Diagnosis not present

## 2023-04-03 DIAGNOSIS — E782 Mixed hyperlipidemia: Secondary | ICD-10-CM | POA: Diagnosis not present

## 2023-04-09 ENCOUNTER — Other Ambulatory Visit: Payer: Self-pay | Admitting: Physician Assistant

## 2023-04-09 DIAGNOSIS — Z1231 Encounter for screening mammogram for malignant neoplasm of breast: Secondary | ICD-10-CM

## 2023-04-10 DIAGNOSIS — Z8639 Personal history of other endocrine, nutritional and metabolic disease: Secondary | ICD-10-CM | POA: Diagnosis not present

## 2023-04-10 DIAGNOSIS — Z23 Encounter for immunization: Secondary | ICD-10-CM | POA: Diagnosis not present

## 2023-04-10 DIAGNOSIS — E782 Mixed hyperlipidemia: Secondary | ICD-10-CM | POA: Diagnosis not present

## 2023-04-10 DIAGNOSIS — Z1231 Encounter for screening mammogram for malignant neoplasm of breast: Secondary | ICD-10-CM | POA: Diagnosis not present

## 2023-04-10 DIAGNOSIS — Z131 Encounter for screening for diabetes mellitus: Secondary | ICD-10-CM | POA: Diagnosis not present

## 2023-04-10 DIAGNOSIS — M810 Age-related osteoporosis without current pathological fracture: Secondary | ICD-10-CM | POA: Diagnosis not present

## 2023-04-10 DIAGNOSIS — E538 Deficiency of other specified B group vitamins: Secondary | ICD-10-CM | POA: Diagnosis not present

## 2023-04-10 DIAGNOSIS — Z0000 Encounter for general adult medical examination without abnormal findings: Secondary | ICD-10-CM | POA: Diagnosis not present

## 2023-05-01 DIAGNOSIS — E538 Deficiency of other specified B group vitamins: Secondary | ICD-10-CM | POA: Diagnosis not present

## 2023-05-12 ENCOUNTER — Ambulatory Visit
Admission: RE | Admit: 2023-05-12 | Discharge: 2023-05-12 | Disposition: A | Discharge status: Home / Self Care | Payer: Medicare HMO | Source: Ambulatory Visit | Attending: Physician Assistant | Admitting: Physician Assistant

## 2023-05-12 DIAGNOSIS — Z1231 Encounter for screening mammogram for malignant neoplasm of breast: Secondary | ICD-10-CM | POA: Diagnosis not present

## 2023-06-02 DIAGNOSIS — E538 Deficiency of other specified B group vitamins: Secondary | ICD-10-CM | POA: Diagnosis not present

## 2023-07-02 DIAGNOSIS — H43813 Vitreous degeneration, bilateral: Secondary | ICD-10-CM | POA: Diagnosis not present

## 2023-07-02 DIAGNOSIS — H35341 Macular cyst, hole, or pseudohole, right eye: Secondary | ICD-10-CM | POA: Diagnosis not present

## 2023-07-02 DIAGNOSIS — H35411 Lattice degeneration of retina, right eye: Secondary | ICD-10-CM | POA: Diagnosis not present

## 2023-07-02 DIAGNOSIS — H35371 Puckering of macula, right eye: Secondary | ICD-10-CM | POA: Diagnosis not present

## 2023-07-03 DIAGNOSIS — E538 Deficiency of other specified B group vitamins: Secondary | ICD-10-CM | POA: Diagnosis not present

## 2023-08-04 DIAGNOSIS — E538 Deficiency of other specified B group vitamins: Secondary | ICD-10-CM | POA: Diagnosis not present

## 2023-08-25 DIAGNOSIS — I10 Essential (primary) hypertension: Secondary | ICD-10-CM | POA: Diagnosis not present

## 2023-09-02 DIAGNOSIS — H524 Presbyopia: Secondary | ICD-10-CM | POA: Diagnosis not present

## 2023-09-04 DIAGNOSIS — E538 Deficiency of other specified B group vitamins: Secondary | ICD-10-CM | POA: Diagnosis not present

## 2023-10-02 DIAGNOSIS — E538 Deficiency of other specified B group vitamins: Secondary | ICD-10-CM | POA: Diagnosis not present

## 2023-10-02 DIAGNOSIS — Z8639 Personal history of other endocrine, nutritional and metabolic disease: Secondary | ICD-10-CM | POA: Diagnosis not present

## 2023-10-02 DIAGNOSIS — E782 Mixed hyperlipidemia: Secondary | ICD-10-CM | POA: Diagnosis not present

## 2023-10-02 DIAGNOSIS — Z131 Encounter for screening for diabetes mellitus: Secondary | ICD-10-CM | POA: Diagnosis not present

## 2023-10-09 DIAGNOSIS — E538 Deficiency of other specified B group vitamins: Secondary | ICD-10-CM | POA: Diagnosis not present

## 2023-10-09 DIAGNOSIS — H524 Presbyopia: Secondary | ICD-10-CM | POA: Diagnosis not present

## 2023-10-09 DIAGNOSIS — E782 Mixed hyperlipidemia: Secondary | ICD-10-CM | POA: Diagnosis not present

## 2023-10-09 DIAGNOSIS — I10 Essential (primary) hypertension: Secondary | ICD-10-CM | POA: Diagnosis not present

## 2023-10-09 DIAGNOSIS — F419 Anxiety disorder, unspecified: Secondary | ICD-10-CM | POA: Diagnosis not present

## 2023-10-09 DIAGNOSIS — M810 Age-related osteoporosis without current pathological fracture: Secondary | ICD-10-CM | POA: Diagnosis not present

## 2023-10-09 DIAGNOSIS — Z0000 Encounter for general adult medical examination without abnormal findings: Secondary | ICD-10-CM | POA: Diagnosis not present

## 2023-10-09 DIAGNOSIS — Z8639 Personal history of other endocrine, nutritional and metabolic disease: Secondary | ICD-10-CM | POA: Diagnosis not present

## 2023-11-10 DIAGNOSIS — E538 Deficiency of other specified B group vitamins: Secondary | ICD-10-CM | POA: Diagnosis not present

## 2023-12-12 DIAGNOSIS — E538 Deficiency of other specified B group vitamins: Secondary | ICD-10-CM | POA: Diagnosis not present

## 2024-01-01 DIAGNOSIS — L821 Other seborrheic keratosis: Secondary | ICD-10-CM | POA: Diagnosis not present

## 2024-01-01 DIAGNOSIS — Z86018 Personal history of other benign neoplasm: Secondary | ICD-10-CM | POA: Diagnosis not present

## 2024-01-01 DIAGNOSIS — L570 Actinic keratosis: Secondary | ICD-10-CM | POA: Diagnosis not present

## 2024-01-01 DIAGNOSIS — L578 Other skin changes due to chronic exposure to nonionizing radiation: Secondary | ICD-10-CM | POA: Diagnosis not present

## 2024-01-01 DIAGNOSIS — Z872 Personal history of diseases of the skin and subcutaneous tissue: Secondary | ICD-10-CM | POA: Diagnosis not present

## 2024-01-01 DIAGNOSIS — Z859 Personal history of malignant neoplasm, unspecified: Secondary | ICD-10-CM | POA: Diagnosis not present

## 2024-01-13 DIAGNOSIS — E538 Deficiency of other specified B group vitamins: Secondary | ICD-10-CM | POA: Diagnosis not present

## 2024-02-13 DIAGNOSIS — E538 Deficiency of other specified B group vitamins: Secondary | ICD-10-CM | POA: Diagnosis not present

## 2024-03-03 DIAGNOSIS — H35341 Macular cyst, hole, or pseudohole, right eye: Secondary | ICD-10-CM | POA: Diagnosis not present

## 2024-03-03 DIAGNOSIS — H35371 Puckering of macula, right eye: Secondary | ICD-10-CM | POA: Diagnosis not present

## 2024-03-03 DIAGNOSIS — H35411 Lattice degeneration of retina, right eye: Secondary | ICD-10-CM | POA: Diagnosis not present

## 2024-03-03 DIAGNOSIS — H43813 Vitreous degeneration, bilateral: Secondary | ICD-10-CM | POA: Diagnosis not present

## 2024-03-12 DIAGNOSIS — M79601 Pain in right arm: Secondary | ICD-10-CM | POA: Diagnosis not present

## 2024-03-15 DIAGNOSIS — E538 Deficiency of other specified B group vitamins: Secondary | ICD-10-CM | POA: Diagnosis not present

## 2024-03-23 DIAGNOSIS — M79601 Pain in right arm: Secondary | ICD-10-CM | POA: Diagnosis not present

## 2024-04-08 DIAGNOSIS — E782 Mixed hyperlipidemia: Secondary | ICD-10-CM | POA: Diagnosis not present

## 2024-04-08 DIAGNOSIS — M810 Age-related osteoporosis without current pathological fracture: Secondary | ICD-10-CM | POA: Diagnosis not present

## 2024-04-08 DIAGNOSIS — I10 Essential (primary) hypertension: Secondary | ICD-10-CM | POA: Diagnosis not present

## 2024-04-12 ENCOUNTER — Other Ambulatory Visit: Payer: Self-pay | Admitting: Physician Assistant

## 2024-04-12 DIAGNOSIS — Z1231 Encounter for screening mammogram for malignant neoplasm of breast: Secondary | ICD-10-CM

## 2024-04-19 DIAGNOSIS — E538 Deficiency of other specified B group vitamins: Secondary | ICD-10-CM | POA: Diagnosis not present

## 2024-04-19 DIAGNOSIS — I10 Essential (primary) hypertension: Secondary | ICD-10-CM | POA: Diagnosis not present

## 2024-04-19 DIAGNOSIS — E782 Mixed hyperlipidemia: Secondary | ICD-10-CM | POA: Diagnosis not present

## 2024-04-19 DIAGNOSIS — Z1231 Encounter for screening mammogram for malignant neoplasm of breast: Secondary | ICD-10-CM | POA: Diagnosis not present

## 2024-04-19 DIAGNOSIS — Z1211 Encounter for screening for malignant neoplasm of colon: Secondary | ICD-10-CM | POA: Diagnosis not present

## 2024-04-19 DIAGNOSIS — Z23 Encounter for immunization: Secondary | ICD-10-CM | POA: Diagnosis not present

## 2024-04-19 DIAGNOSIS — Z0000 Encounter for general adult medical examination without abnormal findings: Secondary | ICD-10-CM | POA: Diagnosis not present

## 2024-04-19 DIAGNOSIS — Z1331 Encounter for screening for depression: Secondary | ICD-10-CM | POA: Diagnosis not present

## 2024-04-20 DIAGNOSIS — M79601 Pain in right arm: Secondary | ICD-10-CM | POA: Diagnosis not present

## 2024-05-13 ENCOUNTER — Ambulatory Visit
Admission: RE | Admit: 2024-05-13 | Discharge: 2024-05-13 | Disposition: A | Discharge status: Home / Self Care | Source: Ambulatory Visit | Attending: Physician Assistant | Admitting: Physician Assistant

## 2024-05-13 DIAGNOSIS — Z1231 Encounter for screening mammogram for malignant neoplasm of breast: Secondary | ICD-10-CM | POA: Insufficient documentation

## 2024-05-17 DIAGNOSIS — M79601 Pain in right arm: Secondary | ICD-10-CM | POA: Diagnosis not present

## 2024-05-24 DIAGNOSIS — E538 Deficiency of other specified B group vitamins: Secondary | ICD-10-CM | POA: Diagnosis not present

## 2024-05-31 ENCOUNTER — Ambulatory Visit: Admitting: Anesthesiology

## 2024-05-31 ENCOUNTER — Encounter: Payer: Self-pay | Admitting: Gastroenterology

## 2024-05-31 ENCOUNTER — Ambulatory Visit
Admission: RE | Admit: 2024-05-31 | Discharge: 2024-05-31 | Disposition: A | Attending: Gastroenterology | Admitting: Gastroenterology

## 2024-05-31 ENCOUNTER — Other Ambulatory Visit: Payer: Self-pay

## 2024-05-31 ENCOUNTER — Encounter: Admission: RE | Disposition: A | Payer: Self-pay | Source: Home / Self Care | Attending: Gastroenterology

## 2024-05-31 DIAGNOSIS — Z1211 Encounter for screening for malignant neoplasm of colon: Secondary | ICD-10-CM | POA: Diagnosis not present

## 2024-05-31 HISTORY — PX: COLONOSCOPY: SHX5424

## 2024-05-31 SURGERY — COLONOSCOPY
Anesthesia: General

## 2024-05-31 MED ORDER — PROPOFOL 500 MG/50ML IV EMUL
INTRAVENOUS | Status: DC | PRN
  Administered 2024-05-31: 100 ug/kg/min via INTRAVENOUS

## 2024-05-31 MED ORDER — PROPOFOL 10 MG/ML IV BOLUS
INTRAVENOUS | Status: DC | PRN
  Administered 2024-05-31: 60 mg via INTRAVENOUS

## 2024-05-31 MED ORDER — DEXMEDETOMIDINE HCL IN NACL 80 MCG/20ML IV SOLN
INTRAVENOUS | Status: DC | PRN
  Administered 2024-05-31 (×2): 6 ug via INTRAVENOUS

## 2024-05-31 MED ORDER — SODIUM CHLORIDE 0.9 % IV SOLN: INTRAVENOUS | Status: DC

## 2024-05-31 NOTE — Op Note (Signed)
 [facility] Patient Name: Mary Potts Procedure Date: 05/31/2024 9:07 AM MRN: [id number] Account #: [id number] Date of Birth: May 21, 1951 Admit Type: Outpatient Age: 73 Room: [facility] ENDO ROOM 1 Gender: Female Note Status: Finalized Instrument Name: Colon Scope [id number] Procedure:             Colonoscopy Indications:           Screening for colorectal malignant neoplasm Providers:             Ruel Kung MD, MD Referring MD:          Eva DOROTHA Crimes, MD (Referring MD) Medicines:             Monitored Anesthesia Care Complications:         No immediate complications. Procedure:             Pre-Anesthesia Assessment:                        - Prior to the procedure, a History and Physical was                         performed, and patient medications, allergies and                         sensitivities were reviewed. The patient's tolerance                         of previous anesthesia was reviewed.                        - The risks and benefits of the procedure and the                         sedation options and risks were discussed with the                         patient. All questions were answered and informed                         consent was obtained.                        - ASA Grade Assessment: II - A patient with mild                         systemic disease.                        After obtaining informed consent, the colonoscope was                         passed under direct vision. Throughout the procedure,                         the patient's blood pressure, pulse, and oxygen                         saturations were monitored continuously. The                         Colonoscope  was introduced through the anus and                         advanced to the the cecum, identified by the                         appendiceal orifice. The colonoscopy was performed                         with ease. The patient tolerated the procedure  well.                         The quality of the bowel preparation was excellent.                         The ileocecal valve, appendiceal orifice, and rectum                         were photographed. Findings:      The perianal and digital rectal examinations were normal.      The entire examined colon appeared normal on direct and retroflexion       views. Impression:            - The entire examined colon is normal on direct and                         retroflexion views.                        - No specimens collected. Recommendation:        - Discharge patient to home (with escort).                        - Resume previous diet.                        - Continue present medications.                        - Repeat colonoscopy is not recommended due to current                         age (52 years or older) for screening purposes. Procedure Code(s):     --- Professional ---                        [id number], Colonoscopy, flexible; diagnostic, including                         collection of specimen(s) by brushing or washing, when                         performed (separate procedure) Diagnosis Code(s):     --- Professional ---                        Z12.11, Encounter for screening for malignant neoplasm                         of colon CPT copyright 2022 American Medical  Association. All rights reserved. The codes documented in this report are preliminary and upon coder review may  be revised to meet current compliance requirements. Ruel Kung, MD Ruel Kung MD, MD 05/31/2024 9:35:53 AM This report has been signed electronically. Number of Addenda: 0 Note Initiated On: 05/31/2024 9:07 AM Scope Withdrawal Time: 0 hours 13 minutes 12 seconds  Total Procedure Duration: 0 hours 19 minutes 13 seconds  Estimated Blood Loss:  Estimated blood loss: none.      [facility]

## 2024-05-31 NOTE — H&P (Signed)
 Mary Potts , MD [address], [place], [place], [address] Phone: [phone] Fax: [phone]  Primary Care Physician:  Marikay Eva POUR, [place]   Pre-Procedure History & Physical: HPI:  Mary Potts is a 73 y.o. female is here for an colonoscopy.   Past Medical History:  Diagnosis Date   HLD (hyperlipidemia)    Thyroiditis     Past Surgical History:  Procedure Laterality Date   DIAGNOSTIC LAPAROSCOPY     EXCISION MASS UPPER EXTREMETIES Left 10/08/2019   Procedure: EXCISION MASS UPPER EXTREMETIES;  Surgeon: Rodolph Romano, MD;  Location: [facility] ORS;  Service: General;  Laterality: Left;   TONSILLECTOMY      Prior to Admission medications   Medication Sig Start Date End Date Taking? Authorizing Provider  alendronate (FOSAMAX) 70 MG tablet Take 70 mg by mouth every Sunday. Take with a full glass of water on an empty stomach.   Yes [provider]  ascorbic acid (VITAMIN C) 500 MG tablet Take 500 mg by mouth daily.   Yes [provider]  Cyanocobalamin  (B-12 COMPLIANCE INJECTION) 1000 MCG/ML KIT Inject 1,000 mcg as directed every 30 (thirty) days.   Yes [provider]  diazepam (VALIUM) 5 MG tablet Take 5 mg by mouth every 6 (six) hours as needed for anxiety.   Yes [provider]  Garlic 1000 MG CAPS Take 1,000 mg by mouth daily.   Yes [provider]  Lecithin 400 MG CAPS Take 400 mg by mouth daily.   Yes [provider]  Omega-3 1000 MG CAPS Take 1,000 mg by mouth daily.   Yes [provider]  Red Yeast Rice 600 MG CAPS Take 600 mg by mouth daily.   Yes [provider]  rosuvastatin (CRESTOR) 10 MG tablet Take 10 mg by mouth daily.   Yes [provider]  vitamin E 400 UNIT capsule Take 400 Units by mouth daily.   Yes [provider]     Allergies as of 05/01/2024 - Review Complete 10/08/2019  Allergen Reaction Noted   Sulfa antibiotics Hives 04/22/2013    Family History  Problem Relation Age of Onset   Breast cancer Neg Hx     Social History   Socioeconomic History   Marital status: Married    Spouse name: Not on file   Number of children: Not on file   Years of education: Not on file   Highest education level: Not on file  Occupational History   Not on file  Tobacco Use   Smoking status: Never   Smokeless tobacco: Never  Vaping Use   Vaping status: Never Used  Substance and Sexual Activity   Alcohol use: No   Drug use: No   Sexual activity: Not on file  Other Topics Concern   Not on file  Social History Narrative   Not on file   Social Drivers of Health   Financial Resource Strain: Low Risk  (04/20/2024)   Received from [facility]   Overall Financial Resource Strain (CARDIA)    Difficulty of Paying Living Expenses: Not hard at all  Food Insecurity: No Food Insecurity (04/20/2024)   Received from [facility]   Hunger Vital Sign    Within the past 12 months, you worried that your food would run out before you got the money to buy more.: Never true    Within the past 12 months, the food you bought just didn't last and you  didn't have money to get more.: Never true  Transportation Needs: No Transportation Needs (04/20/2024)   Received from [facility] - Transportation    In the past 12 months, has lack of transportation kept you from medical appointments or from getting medications?: No    Lack of Transportation (Non-Medical): No  Physical Activity: Not on file  Stress: Not on file  Social Connections: Not on file  Intimate Partner Violence: Not on file    Review of Systems: See HPI, otherwise negative ROS  Physical Exam: BP (!) 159/79   Pulse 80   Temp (!) 96.9 F (36.1 C) (Temporal)   Resp 13   Ht 5' 4 (1.626 m)    Wt 65 kg   SpO2 100%   BMI 24.60 kg/m  General:   Alert,  pleasant and cooperative in NAD Head:  Normocephalic and atraumatic. Neck:  Supple; no masses or thyromegaly. Lungs:  Clear throughout to auscultation, normal respiratory effort.    Heart:  +S1, +S2, Regular rate and rhythm, No edema. Abdomen:  Soft, nontender and nondistended. Normal bowel sounds, without guarding, and without rebound.   Neurologic:  Alert and  oriented x4;  grossly normal neurologically.  Impression/Plan: Mary Potts is here for an colonoscopy to be performed for Screening colonoscopy average risk   Risks, benefits, limitations, and alternatives regarding  colonoscopy have been reviewed with the patient.  Questions have been answered.  All parties agreeable.   Mary Kung, MD  05/31/2024, 8:35 AM

## 2024-05-31 NOTE — Anesthesia Preprocedure Evaluation (Addendum)
 Anesthesia Evaluation  Patient identified by MRN, date of birth, ID band Patient awake    Reviewed: Allergy & Precautions, NPO status , Patient's Chart, lab work & pertinent test results  History of Anesthesia Complications Negative for: history of anesthetic complications  Airway Mallampati: III  TM Distance: >3 FB Neck ROM: full    Dental no notable dental hx. (+) Partial Upper, Partial Lower   Pulmonary neg pulmonary ROS   Pulmonary exam normal        Cardiovascular negative cardio ROS Normal cardiovascular exam     Neuro/Psych negative neurological ROS  negative psych ROS   GI/Hepatic negative GI ROS, Neg liver ROS,,,  Endo/Other  negative endocrine ROS    Renal/GU negative Renal ROS  negative genitourinary   Musculoskeletal   Abdominal   Peds  Hematology negative hematology ROS (+)   Anesthesia Other Findings Past Medical History: No date: HLD (hyperlipidemia) No date: Thyroiditis  Past Surgical History: No date: DIAGNOSTIC LAPAROSCOPY 10/08/2019: EXCISION MASS UPPER EXTREMETIES; Left     Comment:  Procedure: EXCISION MASS UPPER EXTREMETIES;  Surgeon:               Rodolph Romano, MD;  Location: [facility] ORS;  Service:              General;  Laterality: Left; No date: TONSILLECTOMY     Reproductive/Obstetrics negative OB ROS                              Anesthesia Physical Anesthesia Plan  ASA: 2  Anesthesia Plan: General   Post-op Pain Management: Minimal or no pain anticipated   Induction: Intravenous  PONV Risk Score and Plan: 2 and Propofol  infusion and TIVA  Airway Management Planned: Natural Airway and Nasal Cannula  Additional Equipment:   Intra-op Plan:   Post-operative Plan:   Informed Consent: I have reviewed the patients History and Physical, chart, labs and discussed the procedure including the risks, benefits and alternatives for the proposed  anesthesia with the patient or authorized representative who has indicated his/her understanding and acceptance.     Dental Advisory Given  Plan Discussed with: Anesthesiologist, CRNA and Surgeon  Anesthesia Plan Comments: (Patient consented for risks of anesthesia including but not limited to:  - adverse reactions to medications - risk of airway placement if required - damage to eyes, teeth, lips or other oral mucosa - nerve damage due to positioning  - sore throat or hoarseness - Damage to heart, brain, nerves, lungs, other parts of body or loss of life  Patient voiced understanding and assent.)         Anesthesia Quick Evaluation

## 2024-05-31 NOTE — Anesthesia Postprocedure Evaluation (Signed)
 Anesthesia Post Note  Patient: Mary Potts  Procedure(s) Performed: COLONOSCOPY  Patient location during evaluation: Endoscopy Anesthesia Type: General Level of consciousness: awake and alert Pain management: pain level controlled Vital Signs Assessment: post-procedure vital signs reviewed and stable Respiratory status: spontaneous breathing, nonlabored ventilation, respiratory function stable and patient connected to nasal cannula oxygen Cardiovascular status: blood pressure returned to baseline and stable Postop Assessment: no apparent nausea or vomiting Anesthetic complications: no   No notable events documented.   Last Vitals:  Vitals:   05/31/24 0956 05/31/24 1006  BP: 109/63 114/67  Pulse: 75 65  Resp: 20 10  Temp:    SpO2: 99% 99%    Last Pain:  Vitals:   05/31/24 1006  TempSrc:   PainSc: 0-No pain                 Lendia LITTIE Mae

## 2024-05-31 NOTE — Transfer of Care (Signed)
 Immediate Anesthesia Transfer of Care Note  Patient: Mary Potts  Procedure(s) Performed: COLONOSCOPY  Patient Location: Endoscopy Unit  Anesthesia Type:General  Level of Consciousness: drowsy  Airway & Oxygen Therapy: Patient Spontanous Breathing  Post-op Assessment: Report given to RN  Post vital signs: stable  Last Vitals:  Vitals Value Taken Time  BP 89/55 05/31/24 09:36  Temp 36.1 C 05/31/24 09:36  Pulse 72 05/31/24 09:36  Resp 12 05/31/24 09:36  SpO2 98 % 05/31/24 09:36    Last Pain:  Vitals:   05/31/24 0936  TempSrc: Tympanic  PainSc: Asleep         Complications: No notable events documented.
